# Patient Record
Sex: Female | Born: 1980 | Hispanic: No | Marital: Married | Smoking: Never smoker
Health system: Southern US, Community
[De-identification: ages and names within clinical notes are randomized; demographics above are authoritative.]

## PROBLEM LIST (undated history)

## (undated) DIAGNOSIS — IMO0002 Reserved for concepts with insufficient information to code with codable children: Secondary | ICD-10-CM

## (undated) DIAGNOSIS — D259 Leiomyoma of uterus, unspecified: Secondary | ICD-10-CM

## (undated) DIAGNOSIS — D696 Thrombocytopenia, unspecified: Secondary | ICD-10-CM

## (undated) HISTORY — PX: UTERINE FIBROID SURGERY: SHX826

## (undated) HISTORY — DX: Leiomyoma of uterus, unspecified: D25.9

## (undated) HISTORY — PX: NO PAST SURGERIES: SHX2092

## (undated) HISTORY — DX: Reserved for concepts with insufficient information to code with codable children: IMO0002

---

## 2009-06-17 ENCOUNTER — Inpatient Hospital Stay (HOSPITAL_COMMUNITY): Admission: AD | Admit: 2009-06-17 | Discharge: 2009-06-17 | Payer: Self-pay | Admitting: Obstetrics & Gynecology

## 2009-06-19 ENCOUNTER — Inpatient Hospital Stay (HOSPITAL_COMMUNITY): Admission: AD | Admit: 2009-06-19 | Discharge: 2009-06-19 | Payer: Self-pay | Admitting: Obstetrics & Gynecology

## 2009-06-21 ENCOUNTER — Inpatient Hospital Stay (HOSPITAL_COMMUNITY): Admission: AD | Admit: 2009-06-21 | Discharge: 2009-06-21 | Payer: Self-pay | Admitting: Obstetrics and Gynecology

## 2009-07-02 ENCOUNTER — Inpatient Hospital Stay (HOSPITAL_COMMUNITY): Admission: AD | Admit: 2009-07-02 | Discharge: 2009-07-02 | Payer: Self-pay | Admitting: Obstetrics & Gynecology

## 2009-07-30 ENCOUNTER — Encounter: Payer: Self-pay | Admitting: Family

## 2009-07-30 ENCOUNTER — Ambulatory Visit: Payer: Self-pay | Admitting: Family Medicine

## 2010-07-03 ENCOUNTER — Encounter: Payer: Self-pay | Admitting: Obstetrics & Gynecology

## 2010-07-04 ENCOUNTER — Inpatient Hospital Stay (HOSPITAL_COMMUNITY)
Admission: AD | Admit: 2010-07-04 | Discharge: 2010-07-07 | Payer: Self-pay | Source: Home / Self Care | Attending: Obstetrics and Gynecology | Admitting: Obstetrics and Gynecology

## 2010-07-05 LAB — CBC
Hemoglobin: 11.8 g/dL — ABNORMAL LOW (ref 12.0–15.0)
MCHC: 36.6 g/dL — ABNORMAL HIGH (ref 30.0–36.0)
MCV: 83 fL (ref 78.0–100.0)
Platelets: 124 10*3/uL — ABNORMAL LOW (ref 150–400)
RDW: 13.4 % (ref 11.5–15.5)

## 2010-07-06 LAB — CBC
HCT: 24.7 % — ABNORMAL LOW (ref 36.0–46.0)
Hemoglobin: 12 g/dL (ref 12.0–15.0)
Hemoglobin: 8.9 g/dL — ABNORMAL LOW (ref 12.0–15.0)
MCH: 30.5 pg (ref 26.0–34.0)
MCHC: 36 g/dL (ref 30.0–36.0)
MCV: 84.6 fL (ref 78.0–100.0)
Platelets: 121 10*3/uL — ABNORMAL LOW (ref 150–400)
RBC: 3.96 MIL/uL (ref 3.87–5.11)
RBC: 2.92 MIL/uL — ABNORMAL LOW (ref 3.87–5.11)
WBC: 12.3 10*3/uL — ABNORMAL HIGH (ref 4.0–10.5)

## 2010-07-06 LAB — ABO/RH: ABO/RH(D): AB POS

## 2010-07-06 LAB — RPR: RPR Ser Ql: NONREACTIVE

## 2010-07-07 LAB — CBC
MCH: 30.7 pg (ref 26.0–34.0)
MCHC: 35.9 g/dL (ref 30.0–36.0)
MCV: 85.6 fL (ref 78.0–100.0)
Platelets: 113 10*3/uL — ABNORMAL LOW (ref 150–400)
RBC: 3.06 MIL/uL — ABNORMAL LOW (ref 3.87–5.11)
RDW: 13.4 % (ref 11.5–15.5)

## 2010-08-28 LAB — CBC
HCT: 38.2 % (ref 36.0–46.0)
Hemoglobin: 12.2 g/dL (ref 12.0–15.0)
Hemoglobin: 13 g/dL (ref 12.0–15.0)
Hemoglobin: 13.5 g/dL (ref 12.0–15.0)
MCHC: 34.2 g/dL (ref 30.0–36.0)
MCV: 88 fL (ref 78.0–100.0)
MCV: 88.7 fL (ref 78.0–100.0)
Platelets: 141 10*3/uL — ABNORMAL LOW (ref 150–400)
RBC: 4.02 MIL/uL (ref 3.87–5.11)
WBC: 5.9 10*3/uL (ref 4.0–10.5)
WBC: 7.6 10*3/uL (ref 4.0–10.5)
WBC: 8.5 10*3/uL (ref 4.0–10.5)

## 2010-08-28 LAB — URINE MICROSCOPIC-ADD ON

## 2010-08-28 LAB — DIFFERENTIAL
Basophils Relative: 1 % (ref 0–1)
Eosinophils Relative: 1 % (ref 0–5)
Lymphocytes Relative: 16 % (ref 12–46)
Lymphs Abs: 1.4 10*3/uL (ref 0.7–4.0)
Monocytes Absolute: 0.4 10*3/uL (ref 0.1–1.0)

## 2010-08-28 LAB — WET PREP, GENITAL: Yeast Wet Prep HPF POC: NONE SEEN

## 2010-08-28 LAB — URINALYSIS, ROUTINE W REFLEX MICROSCOPIC
Glucose, UA: NEGATIVE mg/dL
Urobilinogen, UA: 0.2 mg/dL (ref 0.0–1.0)

## 2010-08-28 LAB — ABO/RH: ABO/RH(D): AB POS

## 2010-08-29 LAB — CBC: RDW: 12.4 % (ref 11.5–15.5)

## 2014-02-04 LAB — OB RESULTS CONSOLE HEPATITIS B SURFACE ANTIGEN: HEP B S AG: NEGATIVE

## 2014-02-04 LAB — OB RESULTS CONSOLE RUBELLA ANTIBODY, IGM: Rubella: IMMUNE

## 2014-02-04 LAB — OB RESULTS CONSOLE GC/CHLAMYDIA
CHLAMYDIA, DNA PROBE: NEGATIVE
Chlamydia: NEGATIVE
GC PROBE AMP, GENITAL: NEGATIVE
Gonorrhea: NEGATIVE

## 2014-02-04 LAB — OB RESULTS CONSOLE HIV ANTIBODY (ROUTINE TESTING): HIV: NONREACTIVE

## 2014-05-12 LAB — OB RESULTS CONSOLE RPR: RPR: NONREACTIVE

## 2014-06-12 NOTE — L&D Delivery Note (Addendum)
60434: Tech informed provider of imminent delivery.  In room to assess, patient +5 station with nurse supporting fetal head.  Patient instructed on pushing techniques and delivered as below.   Delivery Note At 4:36 AM, on 08/19/2014, a viable female was delivered via Vaginal, Spontaneous Delivery (Presentation: Occiput Anterior).  Shoulders delivered easily and infant with good tone. Tactile stimulation and bulb suction given by provider and infant placed on mother's abdomen where nurse continued tactile stimulation. Infant APGAR: 7, 9. Cord clamped, cut, and blood collected.  Vaginal inspection revealed 2nd degree lacerations. Lidocaine anesthetic obtained and repair performed with 3-0 vicryl on CT-1.  Placenta delivered spontaneously, after initiation of pitocin for active management, and noted to be intact with 3VC upon inspection.  Patient with constant trickle and cervical laceration could not be ruled out.  Dr. Kathie RhodesS. Nadene Witherspoon consulted and in to assess.  After examination, no cervical laceration found.  Patient tolerated procedure well with IV morphine. Fundus firm, at the umbilicus, and bleeding small.  Mother hemodynamically stable and infant swaddled, with father, prior to provider exit.  Mother unsure of birth control method and opts to breastfeed.  Infant weight at one hour of life: 8 lb 1.6 oz (3674 g).    Anesthesia: Local  Episiotomy: None Lacerations: 2nd degree Suture Repair: 3.0 vicryl Est. Blood Loss (mL): 450  Mom to postpartum.  Baby to Couplet care / Skin to Skin.  EMLY, JESSICA LYNN MSN, CNM 08/19/2014, 6:33 AM   Called in to evaluate for possible cervical laceration. Patient had just received Morphine 4 mg iv Complete cervical revision is normal as well as vaginal revision. Fundus is firm and bleeding is normal. Reassurance

## 2014-07-11 LAB — OB RESULTS CONSOLE GBS: STREP GROUP B AG: POSITIVE

## 2014-08-18 ENCOUNTER — Inpatient Hospital Stay (HOSPITAL_COMMUNITY)
Admission: AD | Admit: 2014-08-18 | Discharge: 2014-08-21 | DRG: 775 | Disposition: A | Discharge status: Home / Self Care | Payer: Medicaid Other | Source: Ambulatory Visit | Attending: Obstetrics and Gynecology | Admitting: Obstetrics and Gynecology

## 2014-08-18 DIAGNOSIS — O99344 Other mental disorders complicating childbirth: Secondary | ICD-10-CM | POA: Diagnosis present

## 2014-08-18 DIAGNOSIS — Z3483 Encounter for supervision of other normal pregnancy, third trimester: Secondary | ICD-10-CM | POA: Diagnosis present

## 2014-08-18 DIAGNOSIS — O99824 Streptococcus B carrier state complicating childbirth: Secondary | ICD-10-CM | POA: Diagnosis present

## 2014-08-18 DIAGNOSIS — O9912 Other diseases of the blood and blood-forming organs and certain disorders involving the immune mechanism complicating childbirth: Secondary | ICD-10-CM | POA: Diagnosis present

## 2014-08-18 DIAGNOSIS — F329 Major depressive disorder, single episode, unspecified: Secondary | ICD-10-CM | POA: Diagnosis present

## 2014-08-18 DIAGNOSIS — O480 Post-term pregnancy: Secondary | ICD-10-CM | POA: Diagnosis present

## 2014-08-18 DIAGNOSIS — Z3A40 40 weeks gestation of pregnancy: Secondary | ICD-10-CM | POA: Diagnosis present

## 2014-08-18 HISTORY — DX: Thrombocytopenia, unspecified: D69.6

## 2014-08-18 NOTE — H&P (Signed)
  Diane Zimmerman is a 34 y.o. female, [id number] at 40.6 weeks, presenting for contractions.  Upon arrival in [facility], patient with SROM.  Reports good fetal movement and denies VB.  GBS Positive and thrombocytopenia.   There are no active problems to display for this patient.   History of present pregnancy: Patient entered care at 13.5 weeks.   EDC of 08/13/2014 was established by Definite LMP of 5/28/25015.   Anatomy scan:  18.4 weeks, with normal findings and an posterior placenta.   Additional US evaluations:   -Anatomy: EFW 8 oz. 35.3 %. Cx length 5.12cm. Vertex pres. Posterial fundal placenta. Cx closed. AFI WNL. Vertical pocket= 4.9 cm. No apparent abnormalities seen. Profile/ Palate seen. NB seen. Philtrum seen. Open hands, 5th digit, heel and feet seen. Female gender. Ovaries and adnexas are WNL -35wks: US: SIUP, VERTEX, NORMAL FLUID, GROWTH 52%.   Significant prenatal events:  Patient without complaints throughout pregnancy. Noted to have thrombocytopenia at 38wks.  Patient also noted as having AB+ bloody type with A2B subgroup of anti-A. Last evaluation:  08/18/2014 by L. Montez Moritaarter, FNP. VE 2/50/-2, FHR 144, BP 100/50, wt 180lbs  OB History    No data available     No past medical history on file. No past surgical history on file. Family History: family history is not on file. Social History:  has no tobacco, alcohol, and drug history on file.   Prenatal Transfer Tool  Maternal Diabetes: No Genetic Screening: Normal Maternal Ultrasounds/Referrals: Normal Fetal Ultrasounds or other Referrals:  None Maternal Substance Abuse:  No Significant Maternal Medications:  None Significant Maternal Lab Results: Lab values include: Group B Strep positive    ROS:  +ctx, +lof, +fm, -vb  Allergies not on file     Blood pressure 126/65, pulse 83, temperature 97.6 F (36.4 C), temperature source Oral, resp. rate 18, height 5\' 4"  (1.626 m), weight 180 lb (81.647 kg), last menstrual  period 11/06/2013, unknown if currently breastfeeding.  Chest clear Heart RRR without murmur Abd gravid, NT Pelvic: 4/90/-2 Ext: WNL  FHR: 145 bpm, Mod Var, -Decels, +Accels UCs: palpates moderate  Prenatal labs: ABO, Rh: --/--/AB POS (03/09 0015) Antibody: NEG (03/09 0015) Rubella:   Immune RPR: Nonreactive (12/01 0000)  HBsAg: Negative (08/26 0000)  HIV: Non-reactive (08/26 0000)  GBS: Positive (01/30 0000) Sickle cell/Hgb electrophoresis:  Normal Pap:  Abnormal-ASCUS GC:  Negative Chlamydia:  Negative Genetic screenings:  Negative Glucola:  Negative Other:  HPV-Negative    Assessment IUP at 40.6wks Cat I FT GBS Positive SROM Desires Unmedicated Birth  [facility]: [facility] consult with Dr. Kathie RhodesS. Rivard Routine Labor and Delivery Orders per [facility] Protocol  Gerrit HeckMLY, Cayden Granholm [facility]fieldYNNCNM, MSN 08/18/2014, 11:51 PM

## 2014-08-19 ENCOUNTER — Encounter (HOSPITAL_COMMUNITY): Payer: Self-pay | Admitting: *Deleted

## 2014-08-19 LAB — CBC
HEMATOCRIT: 33.7 % — AB (ref 36.0–46.0)
HEMOGLOBIN: 12 g/dL (ref 12.0–15.0)
MCH: 31.5 pg (ref 26.0–34.0)
MCHC: 35.6 g/dL (ref 30.0–36.0)
MCV: 88.5 fL (ref 78.0–100.0)
Platelets: 106 10*3/uL — ABNORMAL LOW (ref 150–400)
RBC: 3.81 MIL/uL — AB (ref 3.87–5.11)
RDW: 13.3 % (ref 11.5–15.5)
WBC: 9.4 10*3/uL (ref 4.0–10.5)

## 2014-08-19 LAB — RPR: RPR Ser Ql: NONREACTIVE

## 2014-08-19 MED ORDER — SODIUM CHLORIDE 0.9 % IV SOLN
2.0000 g | Freq: Once | INTRAVENOUS | Status: AC
  Administered 2014-08-19: 2 g via INTRAVENOUS
  Filled 2014-08-19: qty 2000

## 2014-08-19 MED ORDER — OXYTOCIN BOLUS FROM INFUSION
500.0000 mL | INTRAVENOUS | Status: DC
  Administered 2014-08-19: 500 mL via INTRAVENOUS

## 2014-08-19 MED ORDER — LACTATED RINGERS IV SOLN
INTRAVENOUS | Status: DC
Start: 2014-08-19 — End: 2014-08-19
  Administered 2014-08-19: via INTRAVENOUS

## 2014-08-19 MED ORDER — LANOLIN HYDROUS EX OINT: TOPICAL_OINTMENT | CUTANEOUS | Status: DC | PRN

## 2014-08-19 MED ORDER — OXYCODONE-ACETAMINOPHEN 5-325 MG PO TABS
2.0000 | ORAL_TABLET | ORAL | Status: DC | PRN
Start: 2014-08-19 — End: 2014-08-21
  Administered 2014-08-19: 2 via ORAL
  Filled 2014-08-19: qty 2

## 2014-08-19 MED ORDER — ZOLPIDEM TARTRATE 5 MG PO TABS: 5.0000 mg | ORAL_TABLET | Freq: Every evening | ORAL | Status: DC | PRN

## 2014-08-19 MED ORDER — WITCH HAZEL-GLYCERIN EX PADS: 1.0000 "application " | MEDICATED_PAD | CUTANEOUS | Status: DC | PRN

## 2014-08-19 MED ORDER — OXYCODONE-ACETAMINOPHEN 5-325 MG PO TABS: 1.0000 | ORAL_TABLET | ORAL | Status: DC | PRN

## 2014-08-19 MED ORDER — MORPHINE SULFATE 4 MG/ML IJ SOLN
4.0000 mg | Freq: Once | INTRAMUSCULAR | Status: AC
  Administered 2014-08-19: 4 mg via INTRAVENOUS
  Filled 2014-08-19: qty 1

## 2014-08-19 MED ORDER — ONDANSETRON HCL 4 MG/2ML IJ SOLN: 4.0000 mg | INTRAMUSCULAR | Status: DC | PRN

## 2014-08-19 MED ORDER — IBUPROFEN 600 MG PO TABS
600.0000 mg | ORAL_TABLET | Freq: Four times a day (QID) | ORAL | Status: DC
  Administered 2014-08-19 – 2014-08-21 (×10): 600 mg via ORAL
  Filled 2014-08-19 (×10): qty 1

## 2014-08-19 MED ORDER — CITRIC ACID-SODIUM CITRATE 334-500 MG/5ML PO SOLN: 30.0000 mL | ORAL | Status: DC | PRN

## 2014-08-19 MED ORDER — SIMETHICONE 80 MG PO CHEW: 80.0000 mg | CHEWABLE_TABLET | ORAL | Status: DC | PRN

## 2014-08-19 MED ORDER — DIBUCAINE 1 % RE OINT
1.0000 "application " | TOPICAL_OINTMENT | RECTAL | Status: DC | PRN
  Filled 2014-08-19: qty 28

## 2014-08-19 MED ORDER — TETANUS-DIPHTH-ACELL PERTUSSIS 5-2.5-18.5 LF-MCG/0.5 IM SUSP: 0.5000 mL | Freq: Once | INTRAMUSCULAR | Status: DC

## 2014-08-19 MED ORDER — BENZOCAINE-MENTHOL 20-0.5 % EX AERO
1.0000 "application " | INHALATION_SPRAY | CUTANEOUS | Status: DC | PRN
  Administered 2014-08-19: 1 via TOPICAL
  Filled 2014-08-19 (×4): qty 56

## 2014-08-19 MED ORDER — SENNOSIDES-DOCUSATE SODIUM 8.6-50 MG PO TABS
2.0000 | ORAL_TABLET | ORAL | Status: DC
  Administered 2014-08-19: 2 via ORAL
  Filled 2014-08-19: qty 2

## 2014-08-19 MED ORDER — NALBUPHINE HCL 10 MG/ML IJ SOLN
10.0000 mg | INTRAMUSCULAR | Status: DC | PRN
  Administered 2014-08-19 (×2): 10 mg via INTRAVENOUS
  Filled 2014-08-19 (×2): qty 1

## 2014-08-19 MED ORDER — PRENATAL MULTIVITAMIN CH
1.0000 | ORAL_TABLET | Freq: Every day | ORAL | Status: DC
  Administered 2014-08-19 – 2014-08-21 (×3): 1 via ORAL
  Filled 2014-08-19 (×3): qty 1

## 2014-08-19 MED ORDER — OXYCODONE-ACETAMINOPHEN 5-325 MG PO TABS: 2.0000 | ORAL_TABLET | ORAL | Status: DC | PRN

## 2014-08-19 MED ORDER — LIDOCAINE HCL (PF) 1 % IJ SOLN
30.0000 mL | INTRAMUSCULAR | Status: AC | PRN
  Administered 2014-08-19: 30 mL via SUBCUTANEOUS
  Filled 2014-08-19: qty 30

## 2014-08-19 MED ORDER — ACETAMINOPHEN 325 MG PO TABS: 650.0000 mg | ORAL_TABLET | ORAL | Status: DC | PRN

## 2014-08-19 MED ORDER — DIPHENHYDRAMINE HCL 25 MG PO CAPS: 25.0000 mg | ORAL_CAPSULE | Freq: Four times a day (QID) | ORAL | Status: DC | PRN

## 2014-08-19 MED ORDER — OXYCODONE-ACETAMINOPHEN 5-325 MG PO TABS
1.0000 | ORAL_TABLET | ORAL | Status: DC | PRN
  Administered 2014-08-19 – 2014-08-21 (×3): 1 via ORAL
  Filled 2014-08-19 (×3): qty 1

## 2014-08-19 MED ORDER — ONDANSETRON HCL 4 MG PO TABS
4.0000 mg | ORAL_TABLET | ORAL | Status: DC | PRN
Start: 2014-08-19 — End: 2014-08-21

## 2014-08-19 MED ORDER — OXYTOCIN 40 UNITS IN LACTATED RINGERS INFUSION - SIMPLE MED
62.5000 mL/h | INTRAVENOUS | Status: DC
  Filled 2014-08-19: qty 1000

## 2014-08-19 MED ORDER — ONDANSETRON HCL 4 MG/2ML IJ SOLN: 4.0000 mg | Freq: Four times a day (QID) | INTRAMUSCULAR | Status: DC | PRN

## 2014-08-19 MED ORDER — LACTATED RINGERS IV SOLN: 500.0000 mL | INTRAVENOUS | Status: DC | PRN

## 2014-08-19 NOTE — Lactation Note (Signed)
This note was copied from the chart of Diane Felina Oswald HillockLaba Ourod Aouda. Lactation Consultation Note Initial visit at 18 hours of age.  Mom is breast and bottle feeding.  Mom reports supplementing with older children as well.  Mom denies pain or concerns about breastfeeding. [facility] resources given and discussed.  Encouraged to feed with early cues on demand.  Early newborn behavior discussed.  Hand expression reported by mom with colostrum visible.  Mom to call for assist as needed.      Patient Name: Diane Zimmerman Reason for consult: Initial assessment   Maternal Data Has patient been taught Hand Expression?: Yes Does the patient have breastfeeding experience prior to this delivery?: Yes  Feeding    LATCH Score/Interventions                Intervention(s): Breastfeeding basics reviewed     Lactation Tools Discussed/Used     Consult Status Consult Status: PRN    Jannifer RodneyShoptaw, [name], 10:48 PM

## 2014-08-19 NOTE — Plan of Care (Signed)
Problem: Phase II Progression Outcomes Goal: Initiate breastfeeding within 1hr delivery Outcome: Not Applicable Date Met:  34/75/83 Patient does not want to breastfeed at this time

## 2014-08-19 NOTE — Progress Notes (Signed)
Subjective: Postpartum Day 0: Vaginal delivery, 2nd degree perineal laceration Patient up ad lib, reports no syncope or dizziness. Feeding:  Breast Contraceptive plan:  Undecided  Objective: Vital signs in last 24 hours: Temp:  [97.6 F (36.4 C)-99.7 F (37.6 C)] 98.2 F (36.8 C) (03/09 0847) Pulse Rate:  [79-90] 83 (03/09 0847) Resp:  [18-24] 18 (03/09 0847) BP: (110-137)/(52-68) 110/58 mmHg (03/09 0847) Weight:  [180 lb (81.647 kg)] 180 lb (81.647 kg) (03/09 0009)  Physical Exam:  General: alert Lochia: appropriate Uterine Fundus: firm Perineum: healing well DVT Evaluation: No evidence of DVT seen on physical exam. Negative Homan's sign.    Recent Labs  08/19/14 0015  HGB 12.0  HCT 33.7*  Platelets 106 on admission  Assessment/Plan: Status post vaginal delivery day 0 Hx thrombocytopenia--platelets 106 on admission Stable Continue current care. CBC tomorrow.   Jeffie Spivack, VICKICNM 08/19/2014, 10:02 AM

## 2014-08-19 NOTE — Plan of Care (Signed)
Problem: Consults Goal: Birthing Suites Patient Information Press F2 to bring up selections list  Outcome: Completed/Met Date Met:  08/19/14  Pt > [redacted] weeks EGA

## 2014-08-19 NOTE — Progress Notes (Signed)
UR chart review completed.  

## 2014-08-20 LAB — CBC
HCT: 28.1 % — ABNORMAL LOW (ref 36.0–46.0)
Hemoglobin: 10 g/dL — ABNORMAL LOW (ref 12.0–15.0)
MCH: 31.5 pg (ref 26.0–34.0)
MCHC: 35.6 g/dL (ref 30.0–36.0)
MCV: 88.6 fL (ref 78.0–100.0)
PLATELETS: 104 10*3/uL — AB (ref 150–400)
RBC: 3.17 MIL/uL — AB (ref 3.87–5.11)
RDW: 13.5 % (ref 11.5–15.5)
WBC: 15 10*3/uL — ABNORMAL HIGH (ref 4.0–10.5)

## 2014-08-20 LAB — HIV ANTIBODY (ROUTINE TESTING W REFLEX): HIV SCREEN 4TH GENERATION: NONREACTIVE

## 2014-08-20 NOTE — Progress Notes (Signed)
Subjective: Postpartum Day 1: Vaginal delivery, 2nd degree perineal laceration Patient up ad lib, reports no syncope or dizziness. Feeding:  Breast and bottle Contraceptive plan:  Undecided  Objective: Vital signs in last 24 hours: Temp:  [98 F (36.7 C)-98.6 F (37 C)] 98 F (36.7 C) (03/10 0523) Pulse Rate:  [73-86] 85 (03/10 0523) Resp:  [18] 18 (03/10 0523) BP: (107-126)/(58-69) 107/63 mmHg (03/10 0523) SpO2:  [100 %] 100 % (03/10 0523)  Physical Exam:  General: alert Lochia: appropriate Uterine Fundus: firm Perineum: healing well DVT Evaluation: No evidence of DVT seen on physical exam. Negative Homan's sign.  CBC Latest Ref Rng 08/20/2014 08/19/2014 07/07/2010  WBC 4.0 - 10.5 K/uL 15.0(H) 9.4 9.4  Hemoglobin 12.0 - 15.0 g/dL 10.0(L) 12.0 9.4(L)  Hematocrit 36.0 - 46.0 % 28.1(L) 33.7(L) 26.2(L)  Platelets 150 - 400 K/uL 104(L) 106(L) 113 CONSISTENT WITH PREVIOUS RESULT(L)    Assessment/Plan: Status post vaginal delivery day 1 Thrombocytopenia--stable Stable Continue current care. Plan for discharge tomorrow    Nyra CapesLATHAM, VICKICNM 08/20/2014, 7:56 AM

## 2014-08-21 MED ORDER — IBUPROFEN 600 MG PO TABS: 600.0000 mg | ORAL_TABLET | Freq: Four times a day (QID) | ORAL | Status: DC

## 2014-08-21 MED ORDER — FERROUS SULFATE 325 (65 FE) MG PO TBEC: 325.0000 mg | DELAYED_RELEASE_TABLET | Freq: Two times a day (BID) | ORAL | Status: DC

## 2014-08-21 MED ORDER — OXYCODONE-ACETAMINOPHEN 5-325 MG PO TABS: 1.0000 | ORAL_TABLET | ORAL | Status: DC | PRN

## 2014-08-21 NOTE — Discharge Summary (Signed)
Vaginal Delivery Discharge Summary  ALL information will be verified prior to discharge  Diane Zimmerman  DOB:    11-Nov-1980 MRN:    [id number] CSN:    [id number]  Date of admission:                  08/18/14  Date of discharge:                   08/21/14  Procedures this admission: SVD  Date of Delivery: 08/19/14  Newborn Data:  Live born  Information for the patient's newborn:  Jadamarie Butson, Girl Sienna [[id number]]  female  APGAR ,   Live born female  Birth Weight: 8 lb 1.6 oz (3674 g) APGAR: 7, 9  Weight    Home with mother.   History of Present Illness: Diane Zimmerman is a 34 y.o. female, [phone], who presents at [redacted]w[redacted]d weeks gestation. The patient has been followed at the [facility] division of [facility]. She was admitted onset of labor. Her pregnancy has been complicated by:  Patient Active Problem List   Diagnosis Date Noted  . SVD (spontaneous vaginal delivery) 08/19/2014  . Second-degree perineal laceration, with delivery 08/19/2014    Hospital course: The patient was admitted for labor.   Her labor was not complicated. She proceeded to have a vaginal delivery of a healthy infant. Her delivery was not complicated. Her postpartum course was not complicated. She was discharged to home on postpartum day 2 doing well.  Feeding: breast  Contraception: unsure   Discharge hemoglobin: HEMOGLOBIN  Date Value Ref Range Status  08/20/2014 10.0* 12.0 - 15.0 g/dL Final   HCT  Date Value Ref Range Status  08/20/2014 28.1* 36.0 - 46.0 % Final    PreNatal Labs ABO, Rh: --/--/AB POS (03/09 0015)   Antibody: NEG (03/09 0015) Rubella:   immune RPR: Non Reactive (03/09 0015)  HBsAg: Negative (08/26 0000)  HIV: Non-reactive (08/26 0000)  GBS: Positive (01/30 0000)  Discharge Physical Exam:  General: alert and cooperative Lochia: appropriate Uterine Fundus: firm Incision: healing  well DVT Evaluation: No evidence of DVT seen on physical exam.  Intrapartum Procedures: spontaneous vaginal delivery and GBS prophylaxis Postpartum Procedures: none Complications-Operative and Postpartum: 2 degree perineal laceration  Discharge Diagnoses: Post-date pregnancy,  asymptomatic anemia  Activity:           pelvic rest Diet:                routine Medications: PNV, Ibuprofen, Iron and Percocet Condition:      stable     Postpartum Teaching: Nutrition, exercise, return to work or school, family visits, sexual activity, home rest, vaginal bleeding, pelvic rest, family planning, s/s of PPD, breast care peri-care and incision care   Discharge to: home  Follow-up Information    Follow up with [facility]. Schedule an appointment as soon as possible for a visit in 6 weeks.   Specialty:  Obstetrics and Gynecology   Why:  Postpartum check up   Contact information:   [address] [place] [address] [phone]       Adelina Mings, CNM, MSN 08/21/2014. 10:41 AM   Postpartum Care After Vaginal Delivery  After you deliver your newborn (postpartum period), the usual stay in the hospital is 24 72 hours. If there were problems with your labor or delivery, or if you have other medical problems, you  might be in the hospital longer.  While you are in the hospital, you will receive help and instructions on how to care for yourself and your newborn during the postpartum period.  While you are in the hospital:  Be sure to tell your nurses if you have pain or discomfort, as well as where you feel the pain and what makes the pain worse.  If you had an incision made near your vagina (episiotomy) or if you had some tearing during delivery, the nurses may put ice packs on your episiotomy or tear. The ice packs may help to reduce the pain and swelling.  If you are breastfeeding, you may feel uncomfortable contractions of  your uterus for a couple of weeks. This is normal. The contractions help your uterus get back to normal size.  It is normal to have some bleeding after delivery.  For the first 1 3 days after delivery, the flow is red and the amount may be similar to a period.  It is common for the flow to start and stop.  In the first few days, you may pass some small clots. Let your nurses know if you begin to pass large clots or your flow increases.  Zimmerman not  flush blood clots down the toilet before having the nurse look at them.  During the next 3 10 days after delivery, your flow should become more watery and pink or brown-tinged in color.  Ten to fourteen days after delivery, your flow should be a small amount of yellowish-white discharge.  The amount of your flow will decrease over the first few weeks after delivery. Your flow may stop in 6 8 weeks. Most women have had their flow stop by 12 weeks after delivery.  You should change your sanitary pads frequently.  Wash your hands thoroughly with soap and water for at least 20 seconds after changing pads, using the toilet, or before holding or feeding your newborn.  You should feel like you need to empty your bladder within the first 6 8 hours after delivery.  In case you become weak, lightheaded, or faint, call your nurse before you get out of bed for the first time and before you take a shower for the first time.  Within the first few days after delivery, your breasts may begin to feel tender and full. This is called engorgement. Breast tenderness usually goes away within 48 72 hours after engorgement occurs. You may also notice milk leaking from your breasts. If you are not breastfeeding, Zimmerman not stimulate your breasts. Breast stimulation can make your breasts produce more milk.  Spending as much time as possible with your newborn is very important. During this time, you and your newborn can feel close and get to know each other. Having your newborn  stay in your room (rooming in) will help to strengthen the bond with your newborn. It will give you time to get to know your newborn and become comfortable caring for your newborn.  Your hormones change after delivery. Sometimes the hormone changes can temporarily cause you to feel sad or tearful. These feelings should not last more than a few days. If these feelings last longer than that, you should talk to your caregiver.  If desired, talk to your caregiver about methods of family planning or contraception.  Talk to your caregiver about immunizations. Your caregiver may want you to have the following immunizations before leaving the hospital:  Tetanus, diphtheria, and pertussis (Tdap) or tetanus and diphtheria (Td)  immunization. It is very important that you and your family (including grandparents) or others caring for your newborn are up-to-date with the Tdap or Td immunizations. The Tdap or Td immunization can help protect your newborn from getting ill.  Rubella immunization.  Varicella (chickenpox) immunization.  Influenza immunization. You should receive this annual immunization if you did not receive the immunization during your pregnancy. Document Released: 03/26/2007 Document Revised: 02/21/2012 Document Reviewed: 01/24/2012 [facility] Patient Information 2014 [place], [place].   Postpartum Depression and Baby Blues  The postpartum period begins right after the birth of a baby. During this time, there is often a great amount of joy and excitement. It is also a time of considerable changes in the life of the parent(s). Regardless of how many times a mother gives birth, each child brings new challenges and dynamics to the family. It is not unusual to have feelings of excitement accompanied by confusing shifts in moods, emotions, and thoughts. All mothers are at risk of developing postpartum depression or the "baby blues." These mood changes can occur right after giving birth, or they may  occur many months after giving birth. The baby blues or postpartum depression can be mild or severe. Additionally, postpartum depression can resolve rather quickly, or it can be a long-term condition. CAUSES Elevated hormones and their rapid decline are thought to be a main cause of postpartum depression and the baby blues. There are a number of hormones that radically change during and after pregnancy. Estrogen and progesterone usually decrease immediately after delivering your baby. The level of thyroid hormone and various cortisol steroids also rapidly drop. Other factors that play a major role in these changes include major life events and genetics.  RISK FACTORS If you have any of the following risks for the baby blues or postpartum depression, know what symptoms to watch out for during the postpartum period. Risk factors that may increase the likelihood of getting the baby blues or postpartum depression include: 1. Havinga personal or family history of depression. 2. Having depression while being pregnant. 3. Having premenstrual or oral contraceptive-associated mood issues. 4. Having exceptional life stress. 5. Having marital conflict. 6. Lacking a social support network. 7. Having a baby with special needs. 8. Having health problems such as diabetes. SYMPTOMS Baby blues symptoms include:  Brief fluctuations in mood, such as going from extreme happiness to sadness.  Decreased concentration.  Difficulty sleeping.  Crying spells, tearfulness.  Irritability.  Anxiety. Postpartum depression symptoms typically begin within the first month after giving birth. These symptoms include:  Difficulty sleeping or excessive sleepiness.  Marked weight loss.  Agitation.  Feelings of worthlessness.  Lack of interest in activity or food. Postpartum psychosis is a very concerning condition and can be dangerous. Fortunately, it is rare. Displaying any of the following symptoms is cause for  immediate medical attention. Postpartum psychosis symptoms include:  Hallucinations and delusions.  Bizarre or disorganized behavior.  Confusion or disorientation. DIAGNOSIS  A diagnosis is made by an evaluation of your symptoms. There are no medical or lab tests that lead to a diagnosis, but there are various questionnaires that a caregiver may use to identify those with the baby blues, postpartum depression, or psychosis. Often times, a screening tool called the [place] Postnatal Depression Scale is used to diagnose depression in the postpartum period.  TREATMENT The baby blues usually goes away on its own in 1 to 2 weeks. Social support is often all that is needed. You should be encouraged to get adequate  sleep and rest. Occasionally, you may be given medicines to help you sleep.  Postpartum depression requires treatment as it can last several months or longer if it is not treated. Treatment may include individual or group therapy, medicine, or both to address any social, physiological, and psychological factors that may play a role in the depression. Regular exercise, a healthy diet, rest, and social support may also be strongly recommended.  Postpartum psychosis is more serious and needs treatment right away. Hospitalization is often needed. HOME CARE INSTRUCTIONS  Get as much rest as you can. Nap when the baby sleeps.  Exercise regularly. Some women find yoga and walking to be beneficial.  Eat a balanced and nourishing diet.  Zimmerman little things that you enjoy. Have a cup of tea, take a bubble bath, read your favorite magazine, or listen to your favorite music.  Avoid alcohol.  Ask for help with household chores, cooking, grocery shopping, or running errands as needed. Zimmerman not try to Zimmerman everything.  Talk to people close to you about how you are feeling. Get support from your partner, family members, friends, or other new moms.  Try to stay positive in how you think. Think about the  things you are grateful for.  Zimmerman not spend a lot of time alone.  Only take medicine as directed by your caregiver.  Keep all your postpartum appointments.  Let your caregiver know if you have any concerns. SEEK MEDICAL CARE IF: You are having a reaction or problems with your medicine. SEEK IMMEDIATE MEDICAL CARE IF:  You have suicidal feelings.  You feel you may harm the baby or someone else. Document Released: 03/02/2004 Document Revised: 08/21/2011 Document Reviewed: 04/04/2011 [facility] Patient Information 2014 [place], [place].     Breastfeeding Deciding to breastfeed is one of the best choices you can make for you and your baby. A change in hormones during pregnancy causes your breast tissue to grow and increases the number and size of your milk ducts. These hormones also allow proteins, sugars, and fats from your blood supply to make breast milk in your milk-producing glands. Hormones prevent breast milk from being released before your baby is born as well as prompt milk flow after birth. Once breastfeeding has begun, thoughts of your baby, as well as his or her sucking or crying, can stimulate the release of milk from your milk-producing glands.  BENEFITS OF BREASTFEEDING For Your Baby  Your first milk (colostrum) helps your baby's digestive system function better.   There are antibodies in your milk that help your baby fight off infections.   Your baby has a lower incidence of asthma, allergies, and sudden infant death syndrome.   The nutrients in breast milk are better for your baby than infant formulas and are designed uniquely for your baby's needs.   Breast milk improves your baby's brain development.   Your baby is less likely to develop other conditions, such as childhood obesity, asthma, or type 2 diabetes mellitus.  For You   Breastfeeding helps to create a very special bond between you and your baby.   Breastfeeding is convenient. Breast milk is always  available at the correct temperature and costs nothing.   Breastfeeding helps to burn calories and helps you lose the weight gained during pregnancy.   Breastfeeding makes your uterus contract to its prepregnancy size faster and slows bleeding (lochia) after you give birth.   Breastfeeding helps to lower your risk of developing type 2 diabetes mellitus, osteoporosis, and breast  or ovarian cancer later in life. SIGNS THAT YOUR BABY IS HUNGRY Early Signs of Hunger  Increased alertness or activity.  Stretching.  Movement of the head from side to side.  Movement of the head and opening of the mouth when the corner of the mouth or cheek is stroked (rooting).  Increased sucking sounds, smacking lips, cooing, sighing, or squeaking.  Hand-to-mouth movements.  Increased sucking of fingers or hands. Late Signs of Hunger  Fussing.  Intermittent crying. Extreme Signs of Hunger Signs of extreme hunger will require calming and consoling before your baby will be able to breastfeed successfully. Zimmerman not wait for the following signs of extreme hunger to occur before you initiate breastfeeding:   Restlessness.  A loud, strong cry.   Screaming.   BREASTFEEDING BASICS Breastfeeding Initiation  Find a comfortable place to sit or lie down, with your neck and back well supported.  Place a pillow or rolled up blanket under your baby to bring him or her to the level of your breast (if you are seated). Nursing pillows are specially designed to help support your arms and your baby while you breastfeed.  Make sure that your baby's abdomen is facing your abdomen.   Gently massage your breast. With your fingertips, massage from your chest wall toward your nipple in a circular motion. This encourages milk flow. You may need to continue this action during the feeding if your milk flows slowly.  Support your breast with 4 fingers underneath and your thumb above your nipple. Make sure your  fingers are well away from your nipple and your baby's mouth.   Stroke your baby's lips gently with your finger or nipple.   When your baby's mouth is open wide enough, quickly bring your baby to your breast, placing your entire nipple and as much of the colored area around your nipple (areola) as possible into your baby's mouth.   More areola should be visible above your baby's upper lip than below the lower lip.   Your baby's tongue should be between his or her lower gum and your breast.   Ensure that your baby's mouth is correctly positioned around your nipple (latched). Your baby's lips should create a seal on your breast and be turned out (everted).  It is common for your baby to suck about 2-3 minutes in order to start the flow of breast milk. Latching Teaching your baby how to latch on to your breast properly is very important. An improper latch can cause nipple pain and decreased milk supply for you and poor weight gain in your baby. Also, if your baby is not latched onto your nipple properly, he or she may swallow some air during feeding. This can make your baby fussy. Burping your baby when you switch breasts during the feeding can help to get rid of the air. However, teaching your baby to latch on properly is still the best way to prevent fussiness from swallowing air while breastfeeding. Signs that your baby has successfully latched on to your nipple:    Silent tugging or silent sucking, without causing you pain.   Swallowing heard between every 3-4 sucks.    Muscle movement above and in front of his or her ears while sucking.  Signs that your baby has not successfully latched on to nipple:   Sucking sounds or smacking sounds from your baby while breastfeeding.  Nipple pain. If you think your baby has not latched on correctly, slip your finger into the corner of  your baby's mouth to break the suction and place it between your baby's gums. Attempt breastfeeding  initiation again. Signs of Successful Breastfeeding Signs from your baby:   A gradual decrease in the number of sucks or complete cessation of sucking.   Falling asleep.   Relaxation of his or her body.   Retention of a small amount of milk in his or her mouth.   Letting go of your breast by himself or herself. Signs from you:  Breasts that have increased in firmness, weight, and size 1-3 hours after feeding.   Breasts that are softer immediately after breastfeeding.  Increased milk volume, as well as a change in milk consistency and color by the fifth day of breastfeeding.   Nipples that are not sore, cracked, or bleeding. Signs That Your Pecola LeisureBaby is Getting Enough Milk  Wetting at least 3 diapers in a 24-hour period. The urine should be clear and pale yellow by age 655 days.  At least 3 stools in a 24-hour period by age 655 days. The stool should be soft and yellow.  At least 3 stools in a 24-hour period by age 65 days. The stool should be seedy and yellow.  No loss of weight greater than 10% of birth weight during the first 323 days of age.  Average weight gain of 4-7 ounces (113-198 g) per week after age 47 days.  Consistent daily weight gain by age 655 days, without weight loss after the age of 2 weeks. After a feeding, your baby may spit up a small amount. This is common. BREASTFEEDING FREQUENCY AND DURATION Frequent feeding will help you make more milk and can prevent sore nipples and breast engorgement. Breastfeed when you feel the need to reduce the fullness of your breasts or when your baby shows signs of hunger. This is called "breastfeeding on demand." Avoid introducing a pacifier to your baby while you are working to establish breastfeeding (the first 4-6 weeks after your baby is born). After this time you may choose to use a pacifier. Research has shown that pacifier use during the first year of a baby's life decreases the risk of sudden infant death syndrome  (SIDS). Allow your baby to feed on each breast as long as he or she wants. Breastfeed until your baby is finished feeding. When your baby unlatches or falls asleep while feeding from the first breast, offer the second breast. Because newborns are often sleepy in the first few weeks of life, you may need to awaken your baby to get him or her to feed. Breastfeeding times will vary from baby to baby. However, the following rules can serve as a guide to help you ensure that your baby is properly fed:  Newborns (babies 264 weeks of age or younger) may breastfeed every 1-3 hours.  Newborns should not go longer than 3 hours during the day or 5 hours during the night without breastfeeding.  You should breastfeed your baby a minimum of 8 times in a 24-hour period until you begin to introduce solid foods to your baby at around 156 months of age. BREAST MILK PUMPING Pumping and storing breast milk allows you to ensure that your baby is exclusively fed your breast milk, even at times when you are unable to breastfeed. This is especially important if you are going back to work while you are still breastfeeding or when you are not able to be present during feedings. Your lactation consultant can give you guidelines on how long it is  safe to store breast milk.  A breast pump is a machine that allows you to pump milk from your breast into a sterile bottle. The pumped breast milk can then be stored in a refrigerator or freezer. Some breast pumps are operated by hand, while others use electricity. Ask your lactation consultant which type will work best for you. Breast pumps can be purchased, but some hospitals and breastfeeding support groups lease breast pumps on a monthly basis. A lactation consultant can teach you how to hand express breast milk, if you prefer not to use a pump.  CARING FOR YOUR BREASTS WHILE YOU BREASTFEED Nipples can become dry, cracked, and sore while breastfeeding. The following recommendations can  help keep your breasts moisturized and healthy:  Avoid using soap on your nipples.   Wear a supportive bra. Although not required, special nursing bras and tank tops are designed to allow access to your breasts for breastfeeding without taking off your entire bra or top. Avoid wearing underwire-style bras or extremely tight bras.  Air dry your nipples for 3-52minutes after each feeding.   Use only cotton bra pads to absorb leaked breast milk. Leaking of breast milk between feedings is normal.   Use lanolin on your nipples after breastfeeding. Lanolin helps to maintain your skin's normal moisture barrier. If you use pure lanolin, you Zimmerman not need to wash it off before feeding your baby again. Pure lanolin is not toxic to your baby. You may also hand express a few drops of breast milk and gently massage that milk into your nipples and allow the milk to air dry. In the first few weeks after giving birth, some women experience extremely full breasts (engorgement). Engorgement can make your breasts feel heavy, warm, and tender to the touch. Engorgement peaks within 3-5 days after you give birth. The following recommendations can help ease engorgement:  Completely empty your breasts while breastfeeding or pumping. You may want to start by applying warm, moist heat (in the shower or with warm water-soaked hand towels) just before feeding or pumping. This increases circulation and helps the milk flow. If your baby does not completely empty your breasts while breastfeeding, pump any extra milk after he or she is finished.  Wear a snug bra (nursing or regular) or tank top for 1-2 days to signal your body to slightly decrease milk production.  Apply ice packs to your breasts, unless this is too uncomfortable for you.  Make sure that your baby is latched on and positioned properly while breastfeeding. If engorgement persists after 48 hours of following these recommendations, contact your health care  provider or a Advertising copywriter. OVERALL HEALTH CARE RECOMMENDATIONS WHILE BREASTFEEDING  Eat healthy foods. Alternate between meals and snacks, eating 3 of each per day. Because what you eat affects your breast milk, some of the foods may make your baby more irritable than usual. Avoid eating these foods if you are sure that they are negatively affecting your baby.  Drink milk, fruit juice, and water to satisfy your thirst (about 10 glasses a day).   Rest often, relax, and continue to take your prenatal vitamins to prevent fatigue, stress, and anemia.  Continue breast self-awareness checks.  Avoid chewing and smoking tobacco.  Avoid alcohol and drug use. Some medicines that may be harmful to your baby can pass through breast milk. It is important to ask your health care provider before taking any medicine, including all over-the-counter and prescription medicine as well as vitamin and herbal supplements.  It is possible to become pregnant while breastfeeding. If birth control is desired, ask your health care provider about options that will be safe for your baby. SEEK MEDICAL CARE IF:   You feel like you want to stop breastfeeding or have become frustrated with breastfeeding.  You have painful breasts or nipples.  Your nipples are cracked or bleeding.  Your breasts are red, tender, or warm.  You have a swollen area on either breast.  You have a fever or chills.  You have nausea or vomiting.  You have drainage other than breast milk from your nipples.  Your breasts Zimmerman not become full before feedings by the fifth day after you give birth.  You feel sad and depressed.  Your baby is too sleepy to eat well.  Your baby is having trouble sleeping.   Your baby is wetting less than 3 diapers in a 24-hour period.  Your baby has less than 3 stools in a 24-hour period.  Your baby's skin or the white part of his or her eyes becomes yellow.   Your baby is not gaining weight by  64 days of age. SEEK IMMEDIATE MEDICAL CARE IF:   Your baby is overly tired (lethargic) and does not want to wake up and feed.  Your baby develops an unexplained fever. Document Released: 05/29/2005 Document Revised: 06/03/2013 Document Reviewed: 11/20/2012 [facility] Patient Information 2015 [place], [place]. This information is not intended to replace advice given to you by your health care provider. Make sure you discuss any questions you have with your health care provider.

## 2014-08-22 LAB — TYPE AND SCREEN
ABO/RH(D): AB POS
Antibody Screen: NEGATIVE
Unit division: 0
Unit division: 0

## 2014-09-01 ENCOUNTER — Inpatient Hospital Stay (HOSPITAL_COMMUNITY)
Admission: AD | Admit: 2014-09-01 | Discharge: 2014-09-01 | Disposition: A | Discharge status: Home / Self Care | Payer: Medicaid Other | Source: Ambulatory Visit | Attending: Obstetrics and Gynecology | Admitting: Obstetrics and Gynecology

## 2014-09-01 ENCOUNTER — Other Ambulatory Visit (HOSPITAL_COMMUNITY): Payer: Self-pay | Admitting: Certified Nurse Midwife

## 2014-09-01 ENCOUNTER — Encounter (HOSPITAL_COMMUNITY): Payer: Self-pay | Admitting: *Deleted

## 2014-09-01 DIAGNOSIS — R030 Elevated blood-pressure reading, without diagnosis of hypertension: Secondary | ICD-10-CM | POA: Diagnosis not present

## 2014-09-01 DIAGNOSIS — O9089 Other complications of the puerperium, not elsewhere classified: Secondary | ICD-10-CM | POA: Insufficient documentation

## 2014-09-01 DIAGNOSIS — R51 Headache: Secondary | ICD-10-CM | POA: Diagnosis present

## 2014-09-01 DIAGNOSIS — I159 Secondary hypertension, unspecified: Secondary | ICD-10-CM

## 2014-09-01 LAB — CBC WITH DIFFERENTIAL/PLATELET
Basophils Absolute: 0 10*3/uL (ref 0.0–0.1)
Basophils Relative: 1 % (ref 0–1)
EOS PCT: 1 % (ref 0–5)
Eosinophils Absolute: 0.1 10*3/uL (ref 0.0–0.7)
HCT: 34.6 % — ABNORMAL LOW (ref 36.0–46.0)
Hemoglobin: 12.1 g/dL (ref 12.0–15.0)
LYMPHS ABS: 1.3 10*3/uL (ref 0.7–4.0)
Lymphocytes Relative: 30 % (ref 12–46)
MCH: 30.5 pg (ref 26.0–34.0)
MCHC: 35 g/dL (ref 30.0–36.0)
MCV: 87.2 fL (ref 78.0–100.0)
MONOS PCT: 8 % (ref 3–12)
Monocytes Absolute: 0.3 10*3/uL (ref 0.1–1.0)
NEUTROS PCT: 60 % (ref 43–77)
Neutro Abs: 2.6 10*3/uL (ref 1.7–7.7)
PLATELETS: 196 10*3/uL (ref 150–400)
RBC: 3.97 MIL/uL (ref 3.87–5.11)
RDW: 12.5 % (ref 11.5–15.5)
WBC: 4.3 10*3/uL (ref 4.0–10.5)

## 2014-09-01 LAB — URINALYSIS, ROUTINE W REFLEX MICROSCOPIC
BILIRUBIN URINE: NEGATIVE
Glucose, UA: NEGATIVE mg/dL
Ketones, ur: NEGATIVE mg/dL
Nitrite: NEGATIVE
PROTEIN: NEGATIVE mg/dL
Specific Gravity, Urine: 1.015 (ref 1.005–1.030)
UROBILINOGEN UA: 0.2 mg/dL (ref 0.0–1.0)
pH: 6.5 (ref 5.0–8.0)

## 2014-09-01 LAB — COMPREHENSIVE METABOLIC PANEL
ALK PHOS: 92 U/L (ref 39–117)
ALT: 43 U/L — ABNORMAL HIGH (ref 0–35)
AST: 29 U/L (ref 0–37)
Albumin: 3.4 g/dL — ABNORMAL LOW (ref 3.5–5.2)
Anion gap: 6 (ref 5–15)
BUN: 11 mg/dL (ref 6–23)
CALCIUM: 9 mg/dL (ref 8.4–10.5)
CO2: 26 mmol/L (ref 19–32)
Chloride: 107 mmol/L (ref 96–112)
Creatinine, Ser: 0.73 mg/dL (ref 0.50–1.10)
GFR calc Af Amer: >90 mL/min (ref >90)
GFR calc non Af Amer: >90 mL/min (ref >90)
Glucose, Bld: 87 mg/dL (ref 70–99)
Potassium: 4.2 mmol/L (ref 3.5–5.1)
Sodium: 139 mmol/L (ref 135–145)
TOTAL PROTEIN: 6.6 g/dL (ref 6.0–8.3)
Total Bilirubin: 0.3 mg/dL (ref 0.3–1.2)

## 2014-09-01 LAB — PROTEIN / CREATININE RATIO, URINE
Creatinine, Urine: 88 mg/dL
Protein Creatinine Ratio: 0.07 (ref 0.00–0.15)
TOTAL PROTEIN, URINE: 6 mg/dL

## 2014-09-01 LAB — URIC ACID: URIC ACID, SERUM: 6.2 mg/dL (ref 2.4–7.0)

## 2014-09-01 LAB — LACTATE DEHYDROGENASE: LDH: 272 U/L — ABNORMAL HIGH (ref 94–250)

## 2014-09-01 LAB — URINE MICROSCOPIC-ADD ON

## 2014-09-01 MED ORDER — LACTATED RINGERS IV SOLN
INTRAVENOUS | Status: DC
  Administered 2014-09-01 (×2): via INTRAVENOUS

## 2014-09-01 MED ORDER — NIFEDIPINE ER OSMOTIC RELEASE 30 MG PO TB24
30.0000 mg | ORAL_TABLET | Freq: Every day | ORAL | Status: DC
  Administered 2014-09-01: 30 mg via ORAL
  Filled 2014-09-01: qty 1

## 2014-09-01 MED ORDER — PROCHLORPERAZINE EDISYLATE 5 MG/ML IJ SOLN
10.0000 mg | Freq: Once | INTRAMUSCULAR | Status: AC
  Administered 2014-09-01: 10 mg via INTRAVENOUS
  Filled 2014-09-01: qty 2

## 2014-09-01 MED ORDER — HYDRALAZINE HCL 20 MG/ML IJ SOLN: 10.0000 mg | Freq: Once | INTRAMUSCULAR | Status: DC

## 2014-09-01 MED ORDER — HYDRALAZINE HCL 20 MG/ML IJ SOLN
5.0000 mg | Freq: Once | INTRAMUSCULAR | Status: AC
  Administered 2014-09-01: 5 mg via INTRAVENOUS
  Filled 2014-09-01: qty 1

## 2014-09-01 MED ORDER — DIPHENHYDRAMINE HCL 50 MG/ML IJ SOLN
25.0000 mg | Freq: Once | INTRAMUSCULAR | Status: AC
  Administered 2014-09-01: 25 mg via INTRAVENOUS
  Filled 2014-09-01: qty 1

## 2014-09-01 MED ORDER — NIFEDIPINE ER 30 MG PO TB24: 30.0000 mg | ORAL_TABLET | Freq: Every day | ORAL | Status: DC

## 2014-09-01 MED ORDER — DEXAMETHASONE SODIUM PHOSPHATE 10 MG/ML IJ SOLN
10.0000 mg | Freq: Once | INTRAMUSCULAR | Status: AC
  Administered 2014-09-01: 10 mg via INTRAVENOUS
  Filled 2014-09-01: qty 1

## 2014-09-01 NOTE — Discharge Instructions (Signed)
Hypertension Hypertension is another name for high blood pressure. High blood pressure forces your heart to work harder to pump blood. A blood pressure reading has two numbers, which includes a higher number over a lower number (example: 110/72). HOME CARE   Have your blood pressure rechecked by your doctor.  Only take medicine as told by your doctor. Follow the directions carefully. The medicine does not work as well if you skip doses. Skipping doses also puts you at risk for problems.  Do not smoke.  Monitor your blood pressure at home as told by your doctor. GET HELP IF:  You think you are having a reaction to the medicine you are taking.  You have repeat headaches or feel dizzy.  You have puffiness (swelling) in your ankles.  You have trouble with your vision. GET HELP RIGHT AWAY IF:   You get a very bad headache and are confused.  You feel weak, numb, or faint.  You get chest or belly (abdominal) pain.  You throw up (vomit).  You cannot breathe very well. MAKE SURE YOU:   Understand these instructions.  Will watch your condition.  Will get help right away if you are not doing well or get worse. Document Released: 11/15/2007 Document Revised: 06/03/2013 Document Reviewed: 03/21/2013 [facility] Patient Information 2015 [place]. This information is not intended to replace advice given to you by your health care provider. Make sure you discuss any questions you have with your health care provider. Cluster Headache Cluster headaches are recognized by their pattern of deep, intense head pain. They normally occur on one side of your head, but they may "switch sides" in subsequent episodes. Typically, cluster headaches:   Are severe in nature.   Occur repeatedly over weeks to months and are followed by periods of no headaches.   Can last from 15 minutes to 3 hours.   Occur at the same time each day, often at night.   Occur several times a day. CAUSES The  exact cause of cluster headaches is not known. Alcohol use may be associated with cluster headaches. SIGNS AND SYMPTOMS   Severe pain that begins in or around your eye or temple.   One-sided head pain.   Feeling sick to your stomach (nauseous).   Sensitivity to light.   Runny nose.   Eye redness, tearing, and nasal stuffiness on the side of your head where you are experiencing pain.   Sweaty, pale skin of the face.   Droopy or swollen eyelid.   Restlessness. DIAGNOSIS  Cluster headaches are diagnosed based on symptoms and a physical exam. Your health care provider may order a CT scan or an MRI of your head or lab tests to see if your headaches are caused by other medical conditions.  TREATMENT   Medicines for pain relief and to prevent recurrent attacks. Some people may need a combination of medicines.  Oxygen for pain relief.   Biofeedback programs to help reduce headache pain.  It may be helpful to keep a headache diary. This may help you find a trend for what is triggering your headaches. Your health care provider can develop a treatment plan.  HOME CARE INSTRUCTIONS  During cluster periods:   Follow a regular sleep schedule. Do not vary the amount and time that you sleep from day to day. It is important to stay on the same schedule during a cluster period to help prevent headaches.   Avoid alcohol.   Stop smoking if you smoke.  SEEK  MEDICAL CARE IF:  You have any changes from your previous cluster headaches either in intensity or frequency.   You are not getting relief from medicines you are taking.  SEEK IMMEDIATE MEDICAL CARE IF:   You faint.   You have weakness or numbness, especially on one side of your body or face.   You have double vision.   You have nausea or vomiting that is not relieved within several hours.   You cannot keep your balance or have difficulty talking or walking.   You have neck pain or stiffness.   You have a  fever. MAKE SURE YOU:  Understand these instructions.   Will watch your condition.   Will get help right away if you are not doing well or get worse. Document Released: 05/29/2005 Document Revised: 03/19/2013 Document Reviewed: 12/19/2012 [facility] Patient Information 2015 [place], [place]. This information is not intended to replace advice given to you by your health care provider. Make sure you discuss any questions you have with your health care provider.

## 2014-09-01 NOTE — MAU Provider Note (Signed)
Addendum  MAU Addendum Note  Results for orders placed or performed during the hospital encounter of 09/01/14 (from the past 24 hour(s))  Urinalysis, Routine w reflex microscopic     Status: Abnormal   Collection Time: 09/01/14 10:13 AM  Result Value Ref Range   Color, Urine YELLOW YELLOW   APPearance CLEAR CLEAR   Specific Gravity, Urine 1.015 1.005 - 1.030   pH 6.5 5.0 - 8.0   Glucose, UA NEGATIVE NEGATIVE mg/dL   Hgb urine dipstick MODERATE (A) NEGATIVE   Bilirubin Urine NEGATIVE NEGATIVE   Ketones, ur NEGATIVE NEGATIVE mg/dL   Protein, ur NEGATIVE NEGATIVE mg/dL   Urobilinogen, UA 0.2 0.0 - 1.0 mg/dL   Nitrite NEGATIVE NEGATIVE   Leukocytes, UA TRACE (A) NEGATIVE  Protein / creatinine ratio, urine     Status: None   Collection Time: 09/01/14 10:13 AM  Result Value Ref Range   Creatinine, Urine 88.00 mg/dL   Total Protein, Urine 6 mg/dL   Protein Creatinine Ratio 0.07 0.00 - 0.15  Urine microscopic-add on     Status: None   Collection Time: 09/01/14 10:13 AM  Result Value Ref Range   WBC, UA 0-2 <3 WBC/hpf   RBC / HPF 0-2 <3 RBC/hpf   Bacteria, UA RARE RARE  CBC with Differential     Status: Abnormal   Collection Time: 09/01/14 10:39 AM  Result Value Ref Range   WBC 4.3 4.0 - 10.5 K/uL   RBC 3.97 3.87 - 5.11 MIL/uL   Hemoglobin 12.1 12.0 - 15.0 g/dL   HCT 91.4 (L) 78.2 - 95.6 %   MCV 87.2 78.0 - 100.0 fL   MCH 30.5 26.0 - 34.0 pg   MCHC 35.0 30.0 - 36.0 g/dL   RDW 21.3 08.6 - 57.8 %   Platelets 196 150 - 400 K/uL   Neutrophils Relative % 60 43 - 77 %   Neutro Abs 2.6 1.7 - 7.7 K/uL   Lymphocytes Relative 30 12 - 46 %   Lymphs Abs 1.3 0.7 - 4.0 K/uL   Monocytes Relative 8 3 - 12 %   Monocytes Absolute 0.3 0.1 - 1.0 K/uL   Eosinophils Relative 1 0 - 5 %   Eosinophils Absolute 0.1 0.0 - 0.7 K/uL   Basophils Relative 1 0 - 1 %   Basophils Absolute 0.0 0.0 - 0.1 K/uL  Uric acid     Status: None   Collection Time: 09/01/14 11:03 AM  Result Value Ref Range   Uric  Acid, Serum 6.2 2.4 - 7.0 mg/dL  Comprehensive metabolic panel     Status: Abnormal   Collection Time: 09/01/14 11:03 AM  Result Value Ref Range   Sodium 139 135 - 145 mmol/L   Potassium 4.2 3.5 - 5.1 mmol/L   Chloride 107 96 - 112 mmol/L   CO2 26 19 - 32 mmol/L   Glucose, Bld 87 70 - 99 mg/dL   BUN 11 6 - 23 mg/dL   Creatinine, Ser 4.69 0.50 - 1.10 mg/dL   Calcium 9.0 8.4 - 62.9 mg/dL   Total Protein 6.6 6.0 - 8.3 g/dL   Albumin 3.4 (L) 3.5 - 5.2 g/dL   AST 29 0 - 37 U/L   ALT 43 (H) 0 - 35 U/L   Alkaline Phosphatase 92 39 - 117 U/L   Total Bilirubin 0.3 0.3 - 1.2 mg/dL   GFR calc non Af Amer >90 >90 mL/min   GFR calc Af Amer >90 >90 mL/min   Anion  gap 6 5 - 15  Lactate dehydrogenase     Status: Abnormal   Collection Time: 09/01/14 11:03 AM  Result Value Ref Range   LDH 272 (H) 94 - 250 U/L   Filed Vitals:   09/01/14 1316 09/01/14 1330 09/01/14 1346 09/01/14 1408  BP: 188/94 206/101 198/96 177/77  Pulse:    51  Temp:      TempSrc:      Resp:        Plan: Hydralazine 5mg  IVP wait 20 minutes if BP still elevated 10mg  IVP Procardia xl 30mg  Consulted with Dr. Maureen Ralphsillard   Pearlene Teat, CNM, MSN 09/01/2014. 2:42 PM

## 2014-09-01 NOTE — MAU Provider Note (Signed)
Diane Zimmerman is a 34 y.o. G6P1 SP SVD on 3/9   History     Patient Active Problem List   Diagnosis Date Noted  . SVD (spontaneous vaginal delivery) 08/19/2014  . Second-degree perineal laceration, with delivery 08/19/2014    Chief Complaint  Patient presents with  . Headache  . Hypertension   HPI  OB History    Gravida Para Term Preterm AB TAB SAB Ectopic Multiple Living   6 3 1  2  2   0 3      Past Medical History  Diagnosis Date  . Thrombocytopenia     Past Surgical History  Procedure Laterality Date  . No past surgeries      Family History  Problem Relation Age of Onset  . Alcohol abuse Neg Hx   . Arthritis Neg Hx   . Asthma Neg Hx   . Birth defects Neg Hx   . Cancer Neg Hx   . COPD Neg Hx   . Depression Neg Hx   . Diabetes Neg Hx   . Drug abuse Neg Hx   . Early death Neg Hx   . Hearing loss Neg Hx   . Heart disease Neg Hx   . Hyperlipidemia Neg Hx   . Hypertension Neg Hx   . Kidney disease Neg Hx   . Learning disabilities Neg Hx   . Mental illness Neg Hx   . Mental retardation Neg Hx   . Miscarriages / Stillbirths Neg Hx   . Stroke Neg Hx   . Vision loss Neg Hx   . Varicose Veins Neg Hx     History  Substance Use Topics  . Smoking status: Never Smoker   . Smokeless tobacco: Not on file  . Alcohol Use: No    Allergies: No Known Allergies  Prescriptions prior to admission  Medication Sig Dispense Refill Last Dose  . Butalbital-APAP-Caffeine 50-300-40 MG CAPS Take 1 capsule by mouth every 4 (four) hours as needed (headaches).   08/31/2014 at Unknown time  . Docosahexaenoic Acid (DHA PO) Take 1 capsule by mouth daily.   08/31/2014 at Unknown time  . FeFum-FePoly-FA-B Cmp-C-Biot (INTEGRA PLUS PO) Take 1 capsule by mouth every other day.    08/31/2014 at Unknown time  . ferrous sulfate 325 (65 FE) MG EC tablet Take 1 tablet (325 mg total) by mouth 2 (two) times daily. 60 tablet 3 08/31/2014 at Unknown time  . ibuprofen (ADVIL,MOTRIN)  600 MG tablet Take 1 tablet (600 mg total) by mouth every 6 (six) hours. (Patient taking differently: Take 600 mg by mouth every 6 (six) hours as needed for fever or moderate pain. ) 30 tablet 0 08/31/2014 at Unknown time  . oxyCODONE-acetaminophen (PERCOCET/ROXICET) 5-325 MG per tablet Take 1 tablet by mouth every 4 (four) hours as needed (for pain scale less than 7). 30 tablet 0 08/31/2014 at Unknown time  . Prenatal Vit-Fe Fumarate-FA (PRENATAL MULTIVITAMIN) TABS tablet Take 1 tablet by mouth daily at 12 noon.   08/31/2014 at Unknown time    ROS See HPI above, all other systems are negative  Physical Exam   Blood pressure 148/79, pulse 63, temperature 98.9 F (37.2 C), temperature source Oral, resp. rate 18, last menstrual period 11/06/2013, currently breastfeeding. Filed Vitals:   09/01/14 1010 09/01/14 1025  BP: 129/79 148/79  Pulse: 72 63  Temp: 98.9 F (37.2 C)   TempSrc: Oral   Resp: 18 18    Physical Exam Ext:  WNL ABD:  Soft, non tender to palpation, no rebound or guarding    ED Course  Assessment: Acute HA   Plan: Labs: CBC, LDH, CMP, uric acid and  PCR MAU HA protocol, benadryl, decadrone and compazine, IVF and O2   Frankey Botting, CNM, MSN 09/01/2014. 11:42 AM

## 2014-09-01 NOTE — MAU Note (Addendum)
Del 03/09- vag delivered.  No BP problems with preg. Home nurse said BP was up.  Has had a HA past 2 days, took Ibuprofen, did not help.  Visual changes also reported.  baby is doing well, breast feeding, milk is in

## 2014-09-01 NOTE — MAU Note (Signed)
Delivered vaginally on 08-19-14; c/o headache (pain starts at the top of pt's head and comes forward to pt's eyes; pt has used Ibuprofen 600 mg and Fioricet for the headache without any relief; last headache med was taken last night; serial BPs in progress;

## 2014-09-15 ENCOUNTER — Ambulatory Visit (INDEPENDENT_AMBULATORY_CARE_PROVIDER_SITE_OTHER): Payer: Medicaid Other | Admitting: Diagnostic Neuroimaging

## 2014-09-15 ENCOUNTER — Encounter: Payer: Self-pay | Admitting: Diagnostic Neuroimaging

## 2014-09-15 VITALS — BP 120/71 | HR 69 | Ht 64.0 in | Wt 156.4 lb

## 2014-09-15 DIAGNOSIS — R51 Headache: Secondary | ICD-10-CM

## 2014-09-15 DIAGNOSIS — O9089 Other complications of the puerperium, not elsewhere classified: Secondary | ICD-10-CM | POA: Diagnosis not present

## 2014-09-15 DIAGNOSIS — R519 Headache, unspecified: Secondary | ICD-10-CM

## 2014-09-15 NOTE — Patient Instructions (Signed)
Monitor symptoms.   If headache worsens, then call us back and we may check additional testing.

## 2014-09-15 NOTE — Progress Notes (Signed)
[facility]  PATIENT: Diane Zimmerman DOB: 11-02-1980  REFERRING CLINICIAN: Alessandra Grout, FNP HISTORY FROM: patient  REASON FOR VISIT: new consult    HISTORICAL  CHIEF COMPLAINT:  Chief Complaint  Patient presents with  . New Evaluation    generalized headache     HISTORY OF PRESENT ILLNESS:   34 year old right-handed female here for evaluation of headache. Patient delivered her third child on 08/19/14, spontaneous vaginal delivery with no competition. Patient was discharged in good health. On 08/29/14 patient developed onset of vertex, midline headache with eye pain, neck pain. She went back to [facility] hospital for evaluation and was noted to have elevated blood pressure. Patient was treated with headache cocktail medications and prescribed Fioricet. No nausea, vomiting, photophobia or phonophobia. No visual changes. No numbness or tingling in extremities. Patient was then referred to me for consultation of this problem by Montez Morita FNP.  Since that time symptoms have gradually improved. Today she has very low-grade pain 3 out of 10. Day by day symptoms are improving since the peak pain she felt on 09/01/14.    REVIEW OF SYSTEMS: Full 14 system review of systems performed and notable only for headache.  ALLERGIES: No Known Allergies  HOME MEDICATIONS: Outpatient Prescriptions Prior to Visit  Medication Sig Dispense Refill  . Butalbital-APAP-Caffeine 50-300-40 MG CAPS Take 1 capsule by mouth every 4 (four) hours as needed (headaches).    . Docosahexaenoic Acid (DHA PO) Take 1 capsule by mouth daily.    Marland Kitchen FeFum-FePoly-FA-B Cmp-C-Biot (INTEGRA PLUS PO) Take 1 capsule by mouth every other day.     . ferrous sulfate 325 (65 FE) MG EC tablet Take 1 tablet (325 mg total) by mouth 2 (two) times daily. 60 tablet 3  . ibuprofen (ADVIL,MOTRIN) 600 MG tablet Take 1 tablet (600 mg total) by mouth every 6 (six) hours. (Patient taking differently: Take 600 mg by mouth  every 6 (six) hours as needed for fever or moderate pain. ) 30 tablet 0  . NIFEdipine (PROCARDIA-XL/ADALAT CC) 30 MG 24 hr tablet Take 1 tablet (30 mg total) by mouth daily. 30 tablet 1  . oxyCODONE-acetaminophen (PERCOCET/ROXICET) 5-325 MG per tablet Take 1 tablet by mouth every 4 (four) hours as needed (for pain scale less than 7). 30 tablet 0  . Prenatal Vit-Fe Fumarate-FA (PRENATAL MULTIVITAMIN) TABS tablet Take 1 tablet by mouth daily at 12 noon.     No facility-administered medications prior to visit.    PAST MEDICAL HISTORY: Past Medical History  Diagnosis Date  . Thrombocytopenia   . Gave birth to child recently     PAST SURGICAL HISTORY: Past Surgical History  Procedure Laterality Date  . No past surgeries      FAMILY HISTORY: Family History  Problem Relation Age of Onset  . Alcohol abuse Neg Hx   . Arthritis Neg Hx   . Asthma Neg Hx   . Birth defects Neg Hx   . Cancer Neg Hx   . COPD Neg Hx   . Depression Neg Hx   . Diabetes Neg Hx   . Drug abuse Neg Hx   . Early death Neg Hx   . Hearing loss Neg Hx   . Heart disease Neg Hx   . Hyperlipidemia Neg Hx   . Hypertension Neg Hx   . Kidney disease Neg Hx   . Learning disabilities Neg Hx   . Mental illness Neg Hx   . Mental retardation Neg Hx   . Miscarriages /  Stillbirths Neg Hx   . Stroke Neg Hx   . Vision loss Neg Hx   . Varicose Veins Neg Hx   . Healthy Mother     SOCIAL HISTORY:  History   Social History  . Marital Status: Married    Spouse Name: Teacher, early years/pre  . Number of Children: 3  . Years of Education: 12   Occupational History  . Not on file.   Social History Main Topics  . Smoking status: Never Smoker   . Smokeless tobacco: Not on file  . Alcohol Use: No  . Drug Use: No  . Sexual Activity: Not on file   Other Topics Concern  . Not on file   Social History Narrative   Lives with husband and three children    Drinks tea     PHYSICAL EXAM  Filed Vitals:   09/15/14 0849  09/15/14 0850  BP:  120/71  Pulse:  69  Height:  (1.626 m)   Weight: 156 lb 6.4 oz (70.943 kg)     Body mass index is 26.83 kg/(m^2).   Visual Acuity Screening   Right eye Left eye Both eyes  Without correction: 20/100 20/100   With correction:       No flowsheet data found.  GENERAL EXAM: Patient is in no distress; well developed, nourished and groomed; neck is supple  CARDIOVASCULAR: Regular rate and rhythm, no murmurs, no carotid bruits  NEUROLOGIC: MENTAL STATUS: awake, alert, oriented to person, place and time, recent and remote memory intact, normal attention and concentration, language fluent, comprehension intact, naming intact, fund of knowledge appropriate CRANIAL NERVE: no papilledema on fundoscopic exam, pupils equal and reactive to light, visual fields full to confrontation, extraocular muscles intact, no nystagmus, facial sensation and strength symmetric, hearing intact, palate elevates symmetrically, uvula midline, shoulder shrug symmetric, tongue midline. MOTOR: normal bulk and tone, full strength in the BUE, BLE SENSORY: normal and symmetric to light touch, pinprick, temperature, vibration  COORDINATION: finger-nose-finger, fine finger movements, heel-shin normal REFLEXES: deep tendon reflexes present and symmetric GAIT/STATION: narrow based gait; able to walk on toes, heels and tandem; romberg is negative    DIAGNOSTIC DATA (LABS, IMAGING, TESTING) - I reviewed patient records, labs, notes, testing and imaging myself where available.  Lab Results  Component Value Date   WBC 4.3 09/01/2014   HGB 12.1 09/01/2014   HCT 34.6* 09/01/2014   MCV 87.2 09/01/2014   PLT 196 09/01/2014      Component Value Date/Time   NA 139 09/01/2014 1103   K 4.2 09/01/2014 1103   CL 107 09/01/2014 1103   CO2 26 09/01/2014 1103   GLUCOSE 87 09/01/2014 1103   BUN 11 09/01/2014 1103   CREATININE 0.73 09/01/2014 1103   CALCIUM 9.0 09/01/2014 1103   PROT 6.6 09/01/2014  1103   ALBUMIN 3.4* 09/01/2014 1103   AST 29 09/01/2014 1103   ALT 43* 09/01/2014 1103   ALKPHOS 92 09/01/2014 1103   BILITOT 0.3 09/01/2014 1103   GFRNONAA >90 09/01/2014 1103   GFRAA >90 09/01/2014 1103   No results found for: CHOL, HDL, LDLCALC, LDLDIRECT, TRIG, CHOLHDL No results found for: ZOXW9U No results found for: VITAMINB12 No results found for: TSH     ASSESSMENT AND PLAN  34 y.o. year old female here with new onset post-partum headache since 08/29/14. HA spontaneously improving day by day. Neuro exam normal. No other associated neurologic symptoms. Most likely represents a benign postpartum headache, which could be related to  fluid shifts, dehydration, sleep deprivation. No red flags to suggest more ominous cause such as pituitary apoplexy, venous sinus thrombosis, infection or hemorrhage.  PLAN: - monitor symptoms - if HA increases or persists or changes, patient instructed to call us back; at that time we may consider MRI brain or other addl testing  Return if symptoms worsen or fail to improve.    Suanne MarkerVIKRAM R. Tarris Delbene, MD 09/15/2014, 9:09 AM Certified in Neurology, Neurophysiology and Neuroimaging  [facility] [address], [address] [place], [place] [address] [phone]

## 2015-03-30 ENCOUNTER — Other Ambulatory Visit: Payer: Self-pay | Admitting: Infectious Disease

## 2015-03-30 ENCOUNTER — Ambulatory Visit
Admission: RE | Admit: 2015-03-30 | Discharge: 2015-03-30 | Disposition: A | Discharge status: Home / Self Care | Payer: No Typology Code available for payment source | Source: Ambulatory Visit | Attending: Infectious Disease | Admitting: Infectious Disease

## 2015-03-30 DIAGNOSIS — R7611 Nonspecific reaction to tuberculin skin test without active tuberculosis: Secondary | ICD-10-CM

## 2015-07-07 ENCOUNTER — Encounter (HOSPITAL_COMMUNITY): Payer: Self-pay | Admitting: *Deleted

## 2019-10-28 ENCOUNTER — Other Ambulatory Visit: Payer: Self-pay | Admitting: Family Medicine

## 2019-10-28 MED ORDER — ATOVAQUONE-PROGUANIL HCL 250-100 MG PO TABS: 1.0000 | ORAL_TABLET | Freq: Every day | ORAL | 0 refills | Status: DC

## 2019-10-28 NOTE — Progress Notes (Signed)
Patient presented with daughter, Vonzell Schlatter, with request for vaccinations and advice for upcoming travel to [place], [place].

## 2019-12-04 ENCOUNTER — Other Ambulatory Visit (HOSPITAL_COMMUNITY): Payer: Self-pay

## 2021-11-22 ENCOUNTER — Ambulatory Visit (HOSPITAL_COMMUNITY)
Admission: EM | Admit: 2021-11-22 | Discharge: 2021-11-22 | Disposition: A | Discharge status: Home / Self Care | Payer: Commercial Managed Care - HMO | Attending: Physician Assistant | Admitting: Physician Assistant

## 2021-11-22 ENCOUNTER — Encounter (HOSPITAL_COMMUNITY): Payer: Self-pay | Admitting: *Deleted

## 2021-11-22 ENCOUNTER — Ambulatory Visit (INDEPENDENT_AMBULATORY_CARE_PROVIDER_SITE_OTHER): Payer: Commercial Managed Care - HMO

## 2021-11-22 ENCOUNTER — Other Ambulatory Visit: Payer: Self-pay

## 2021-11-22 DIAGNOSIS — M7062 Trochanteric bursitis, left hip: Secondary | ICD-10-CM | POA: Diagnosis not present

## 2021-11-22 DIAGNOSIS — M25552 Pain in left hip: Secondary | ICD-10-CM

## 2021-11-22 MED ORDER — PREDNISONE 10 MG (21) PO TBPK: ORAL_TABLET | ORAL | 0 refills | Status: DC

## 2021-11-22 NOTE — Discharge Instructions (Signed)
Your x-ray showed no evidence of fracture or abnormalities.  I am concerned that you have inflammation of the trochanteric bursa (fluid-filled sac over your hip).  I would like you to take prednisone for inflammation.  Do not take NSAIDs including aspirin, ibuprofen/Advil, naproxen/Aleve.  If your symptoms are not improving quickly with this medication you need to follow-up with orthopedic provider.  Please call to schedule an appointment if needed.  If anything worsens and you have severe pain, fever, nausea, vomiting you need to be seen immediately.

## 2021-11-22 NOTE — ED Triage Notes (Signed)
Pt reports Lt hip pain on Sunday.

## 2021-11-22 NOTE — ED Provider Notes (Signed)
[facility]    CSN: [id number] Arrival date & time: 11/22/21  1724      History   Chief Complaint Chief Complaint  Patient presents with   Hip Pain    HPI Diane Zimmerman is a 41 y.o. female.   Patient presents today with a 3-day history of left lateral hip pain.  She denies any known injury or increase in activity prior to symptom onset.  She denies any recent falls or trauma.  She reports that pain is worse with palpation or movement, rated 8 on a 0-10 pain scale, described as sharp, no alleviating factors identified.  She has tried ibuprofen without improvement of symptoms.  She denies previous injury or surgery involving hip.  She is having difficulty with her daily activities including sleeping through the night and ambulating due to pain.  She reports pain does travel into her left leg but denies any numbness, weakness, paresthesias.  She is confident that she is not pregnant.    Past Medical History:  Diagnosis Date   Gave birth to child recently    Thrombocytopenia [facility])     Patient Active Problem List   Diagnosis Date Noted   SVD (spontaneous vaginal delivery) 08/19/2014   Second-degree perineal laceration, with delivery 08/19/2014    Past Surgical History:  Procedure Laterality Date   NO PAST SURGERIES      OB History     Gravida  6   Para  3   Term  1   Preterm      AB  2   Living  3      SAB  2   IAB      Ectopic      Multiple  0   Live Births  1            Home Medications    Prior to Admission medications   Medication Sig Start Date End Date Taking? Authorizing Provider  predniSONE (STERAPRED UNI-PAK 21 TAB) 10 MG (21) TBPK tablet As directed 11/22/21  Yes Matasha Smigelski K, PA-C  atovaquone-proguanil (MALARONE) 250-100 MG TABS tablet Take 1 tablet by mouth daily. 10/28/19   Simmons-Robinson, Makiera, MD  Butalbital-APAP-Caffeine 50-300-40 MG CAPS Take 1 capsule by mouth every 4 (four) hours as needed  (headaches).    [provider]  Docosahexaenoic Acid (DHA PO) Take 1 capsule by mouth daily.    [provider]  FeFum-FePoly-FA-B Cmp-C-Biot (INTEGRA PLUS PO) Take 1 capsule by mouth every other day.     [provider]  ferrous sulfate 325 (65 FE) MG EC tablet Take 1 tablet (325 mg total) by mouth 2 (two) times daily. 08/21/14 08/21/15  Standard, Venus, CNM  NIFEdipine (PROCARDIA-XL/ADALAT CC) 30 MG 24 hr tablet Take 1 tablet (30 mg total) by mouth daily. 09/01/14   Standard, Venus, CNM  oxyCODONE-acetaminophen (PERCOCET/ROXICET) 5-325 MG per tablet Take 1 tablet by mouth every 4 (four) hours as needed (for pain scale less than 7). 08/21/14   Standard, Venus, CNM  Prenatal Vit-Fe Fumarate-FA (PRENATAL MULTIVITAMIN) TABS tablet Take 1 tablet by mouth daily at 12 noon.    [provider]    Family History Family History  Problem Relation Age of Onset   Alcohol abuse Neg Hx    Arthritis Neg Hx    Asthma Neg Hx    Birth defects Neg Hx    Cancer Neg Hx    COPD Neg Hx    Depression Neg Hx  Diabetes Neg Hx    Drug abuse Neg Hx    Early death Neg Hx    Hearing loss Neg Hx    Heart disease Neg Hx    Hyperlipidemia Neg Hx    Hypertension Neg Hx    Kidney disease Neg Hx    Learning disabilities Neg Hx    Mental illness Neg Hx    Mental retardation Neg Hx    Miscarriages / Stillbirths Neg Hx    Stroke Neg Hx    Vision loss Neg Hx    Varicose Veins Neg Hx    Healthy Mother     Social History Social History   Tobacco Use   Smoking status: Never  Substance Use Topics   Alcohol use: No   Drug use: No     Allergies   Patient has no known allergies.   Review of Systems Review of Systems  Constitutional:  Positive for activity change. Negative for appetite change, fatigue and fever.  Musculoskeletal:  Positive for arthralgias and gait problem. Negative for joint swelling and myalgias.  Neurological:  Negative for weakness and numbness.      Physical Exam Triage Vital Signs ED Triage Vitals  Enc Vitals Group     BP 11/22/21 1817 (!) 155/87     Pulse Rate 11/22/21 1817 86     Resp 11/22/21 1817 16     Temp 11/22/21 1817 98.7 F (37.1 C)     Temp src --      SpO2 11/22/21 1817 100 %     Weight --      Height --      Head Circumference --      Peak Flow --      Pain Score 11/22/21 1815 8     Pain Loc --      Pain Edu? --      Excl. in GC? --    No data found.  Updated Vital Signs BP (!) 155/87   Pulse 86   Temp 98.7 F (37.1 C)   Resp 16   LMP 11/20/2021   SpO2 100%   Visual Acuity Right Eye Distance:   Left Eye Distance:   Bilateral Distance:    Right Eye Near:   Left Eye Near:    Bilateral Near:     Physical Exam Vitals reviewed.  Constitutional:      General: She is awake. She is not in acute distress.    Appearance: Normal appearance. She is well-developed. She is not ill-appearing.     Comments: Very pleasant female appears stated age in no acute distress sitting comfortably in exam room  HENT:     Head: Normocephalic and atraumatic.  Cardiovascular:     Rate and Rhythm: Normal rate and regular rhythm.     Heart sounds: Normal heart sounds, S1 normal and S2 normal. No murmur heard. Pulmonary:     Effort: Pulmonary effort is normal.     Breath sounds: Normal breath sounds. No wheezing, rhonchi or rales.     Comments: Clear to auscultation bilaterally Abdominal:     Palpations: Abdomen is soft.     Tenderness: There is no abdominal tenderness.  Musculoskeletal:     Left hip: Tenderness and bony tenderness present. No deformity. Decreased range of motion.     Left upper leg: No swelling or edema.     Comments: Left hip: Decreased active motion secondary to pain with flexion, extension, abduction, adduction.  Strength 5/5 bilateral lower extremities.  Foot neurovascularly intact.  Psychiatric:        Behavior: Behavior is cooperative.      UC Treatments / Results  Labs (all labs  ordered are listed, but only abnormal results are displayed) Labs Reviewed - No data to display  EKG   Radiology DG Hip Unilat With Pelvis 2-3 Views Left  Result Date: 11/22/2021 CLINICAL DATA:  Left hip pain EXAM: DG HIP (WITH OR WITHOUT PELVIS) 2-3V LEFT COMPARISON:  None Available. FINDINGS: There is no evidence of hip fracture or dislocation. There is no evidence of arthropathy or other focal bone abnormality. IMPRESSION: Negative. Electronically Signed   By: Charlett NoseKevin  Dover M.D.   On: 11/22/2021 19:02    Procedures Procedures (including critical care time)  Medications Ordered in UC Medications - No data to display  Initial Impression / Assessment and Plan / UC Course  I have reviewed the triage vital signs and the nursing notes.  Pertinent labs & imaging results that were available during my care of the patient were reviewed by me and considered in my medical decision making (see chart for details).     X-ray obtained given severity of pain which showed no osseous abnormalities.  Suspect that symptoms are most likely related to trochanteric bursitis.  Patient has difficulty taking NSAIDs and has already failed ibuprofen so we will try prednisone taper.  Discussed that she should not take NSAIDs with this medication due to risk of GI bleeding.  Can use Tylenol for breakthrough pain.  Recommended rest and conservative treatment measures.  If symptoms or not improving she is to follow-up with orthopedics and was given contact information for local provider with instruction to call to schedule an appointment if necessary.  Discussed that if she has any worsening symptoms she is to return for reevaluation.  Strict return precautions given.  Work excuse note provided.  Final Clinical Impressions(s) / UC Diagnoses   Final diagnoses:  Left hip pain  Trochanteric bursitis of left hip     Discharge Instructions      Your x-ray showed no evidence of fracture or abnormalities.  I am  concerned that you have inflammation of the trochanteric bursa (fluid-filled sac over your hip).  I would like you to take prednisone for inflammation.  Do not take NSAIDs including aspirin, ibuprofen/Advil, naproxen/Aleve.  If your symptoms are not improving quickly with this medication you need to follow-up with orthopedic provider.  Please call to schedule an appointment if needed.  If anything worsens and you have severe pain, fever, nausea, vomiting you need to be seen immediately.     ED Prescriptions     Medication Sig Dispense Auth. Provider   predniSONE (STERAPRED UNI-PAK 21 TAB) 10 MG (21) TBPK tablet As directed 21 tablet Hubbert Landrigan K, PA-C      I have reviewed the PDMP during this encounter.   Jeani HawkingRaspet, Delmar Dondero K, PA-C 11/22/21 1935

## 2023-02-08 IMAGING — DX DG HIP (WITH OR WITHOUT PELVIS) 2-3V*L*
3 series · 3 of 3 positions shown · non-contrast
Comparison: None Available.

CLINICAL DATA: Left hip pain

EXAM:
DG HIP (WITH OR WITHOUT PELVIS) 2-3V LEFT

[pelvis ap]
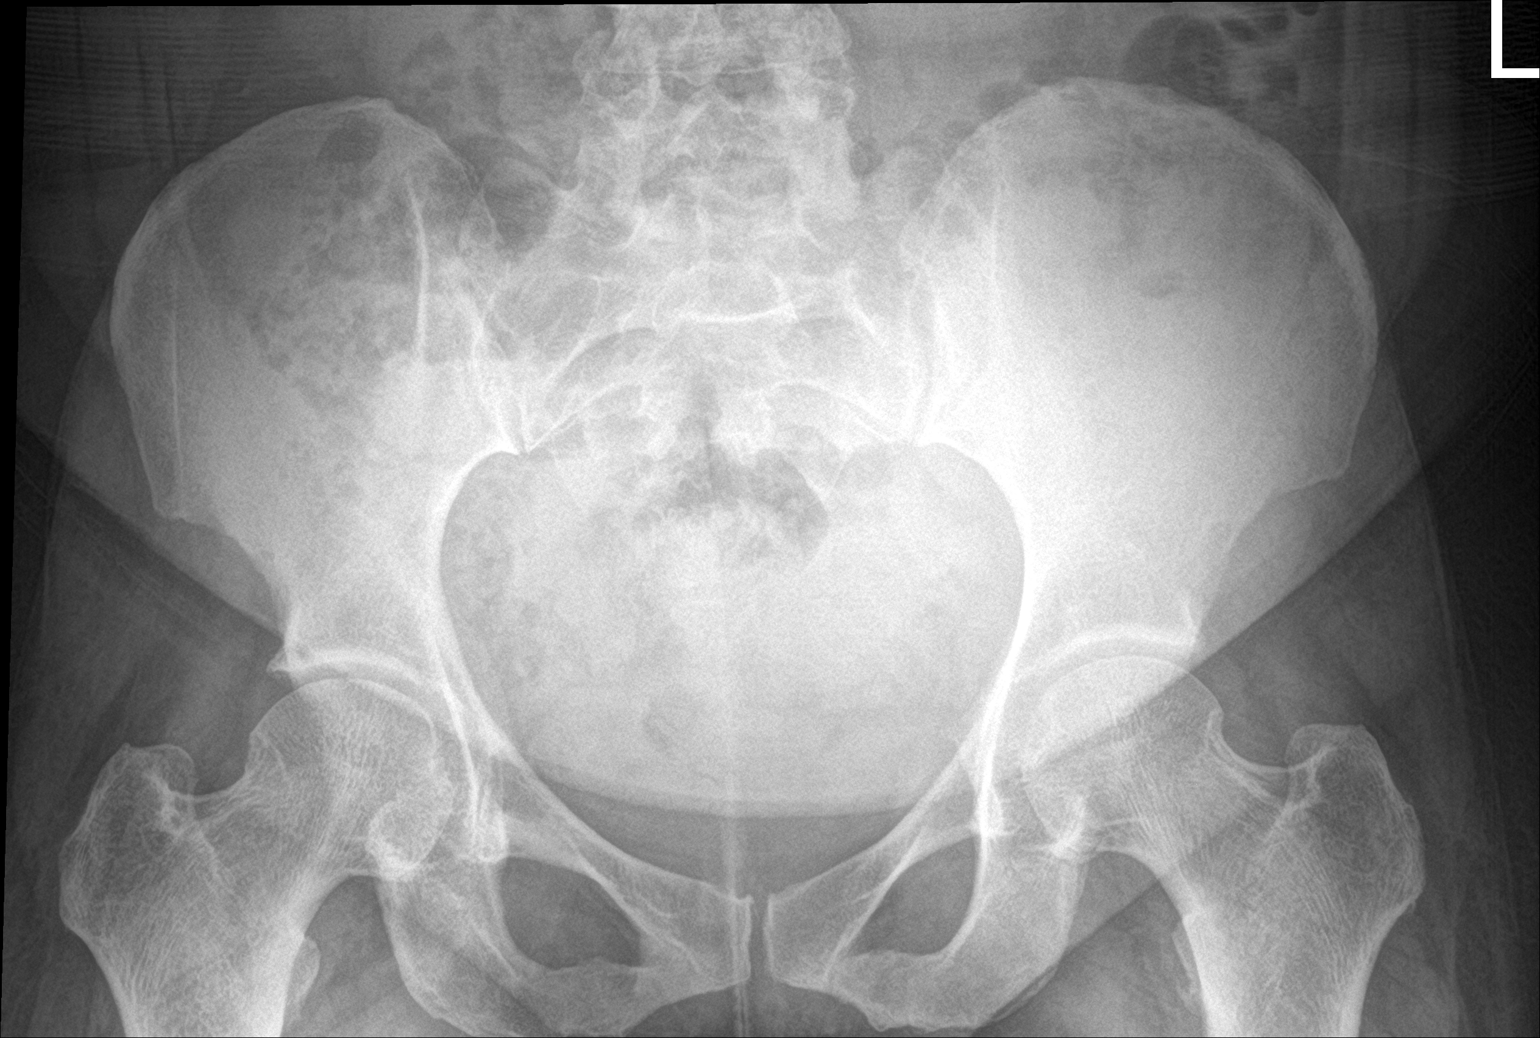

[hip ap]
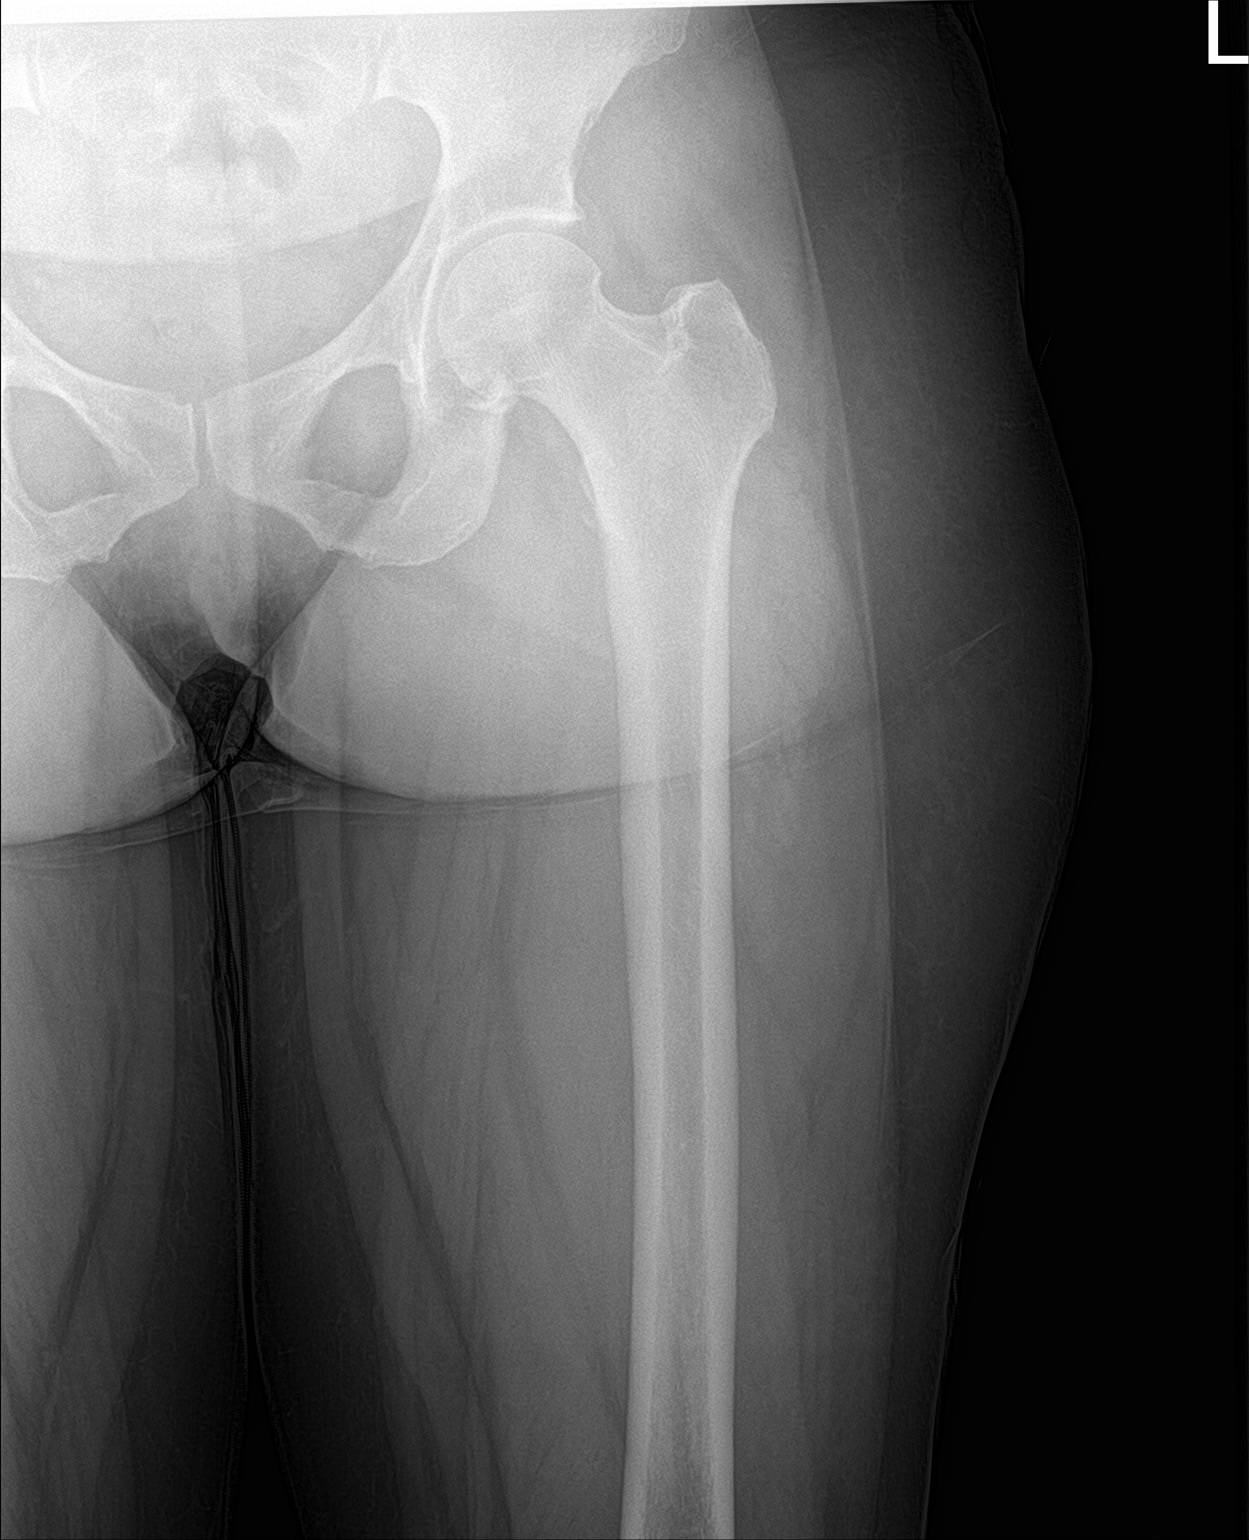

[hip lat]
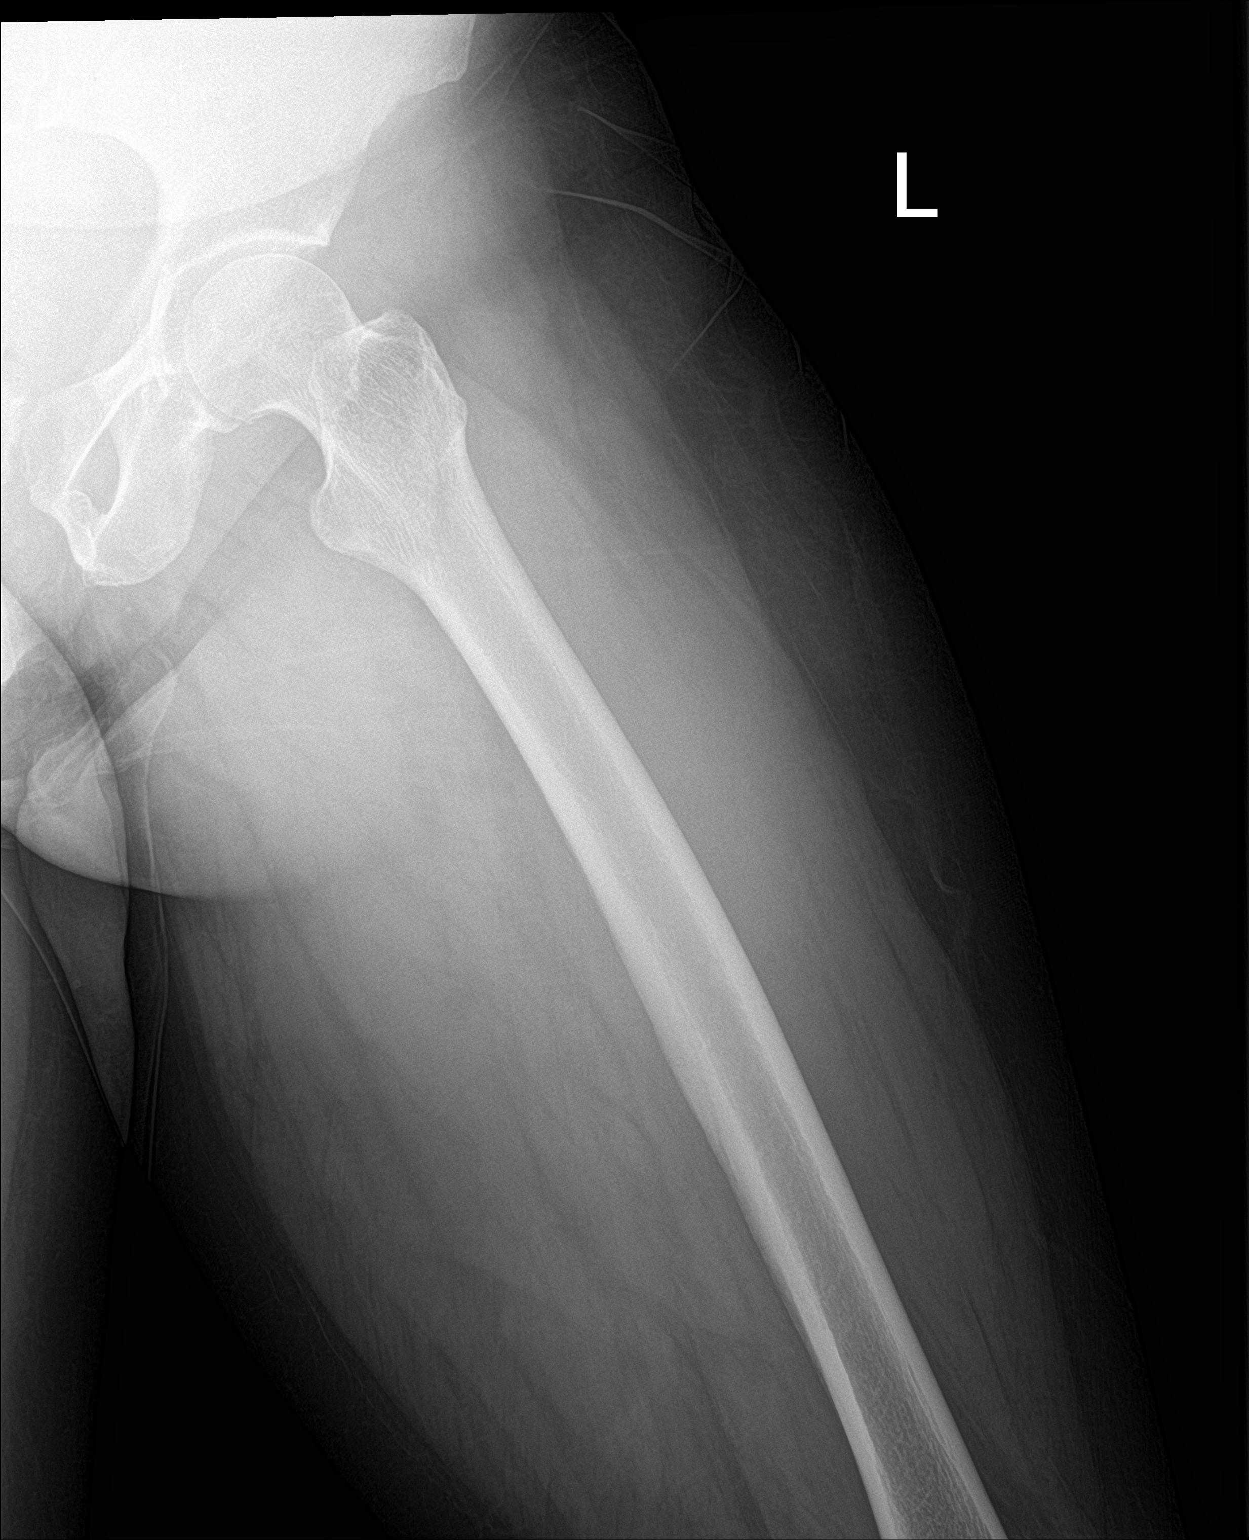

[3 of 3 positions shown; findings below may reference images not displayed]

FINDINGS: There is no evidence of hip fracture or dislocation. There is no
evidence of arthropathy or other focal bone abnormality.
IMPRESSION: Negative.

## 2023-02-24 ENCOUNTER — Ambulatory Visit (HOSPITAL_COMMUNITY)
Admission: EM | Admit: 2023-02-24 | Discharge: 2023-02-24 | Disposition: A | Discharge status: Home / Self Care | Payer: Commercial Managed Care - HMO | Attending: Emergency Medicine | Admitting: Emergency Medicine

## 2023-02-24 ENCOUNTER — Other Ambulatory Visit: Payer: Self-pay

## 2023-02-24 ENCOUNTER — Encounter (HOSPITAL_COMMUNITY): Payer: Self-pay | Admitting: *Deleted

## 2023-02-24 DIAGNOSIS — R1031 Right lower quadrant pain: Secondary | ICD-10-CM | POA: Insufficient documentation

## 2023-02-24 DIAGNOSIS — N3090 Cystitis, unspecified without hematuria: Secondary | ICD-10-CM | POA: Insufficient documentation

## 2023-02-24 LAB — POCT URINALYSIS DIP (MANUAL ENTRY)
Bilirubin, UA: NEGATIVE
Glucose, UA: NEGATIVE mg/dL
Ketones, POC UA: NEGATIVE mg/dL
Nitrite, UA: POSITIVE — AB
Protein Ur, POC: 100 mg/dL — AB
Spec Grav, UA: 1.025 (ref 1.010–1.025)
Urobilinogen, UA: 0.2 U/dL
pH, UA: 5.5 (ref 5.0–8.0)

## 2023-02-24 LAB — POCT URINE PREGNANCY: Preg Test, Ur: NEGATIVE

## 2023-02-24 MED ORDER — NITROFURANTOIN MONOHYD MACRO 100 MG PO CAPS: 100.0000 mg | ORAL_CAPSULE | Freq: Two times a day (BID) | ORAL | 0 refills | Status: DC

## 2023-02-24 MED ORDER — IBUPROFEN 800 MG PO TABS
800.0000 mg | ORAL_TABLET | Freq: Three times a day (TID) | ORAL | 0 refills | Status: DC
Start: 2023-02-24 — End: 2023-03-24

## 2023-02-24 NOTE — Discharge Instructions (Addendum)
You have a urinary tract infection.  Please take all antibiotics as prescribed and until finished, take them with food to help prevent gastrointestinal upset.  For any pain and inflammation you can take 800 mg of ibuprofen 3 times daily with food as well.  Ensure you are drinking only 64 ounces of water daily.  We have sent your urine off for culture and we will contact you if we need to modify your antibiotic treatment.  Return to clinic if no improvement despite finishing antibiotics, you develop nausea, vomiting, flank pain or fever.

## 2023-02-24 NOTE — ED Triage Notes (Signed)
C/O intermittent RLQ and suprapubic abd pain onset yesterday; states started becoming constant @ 0400 this AM. Has taken IBU with slight improvement. C/O painful ambulation. Denies n/v/d. Denies fevers. Denies vaginal discharge.

## 2023-02-24 NOTE — ED Provider Notes (Signed)
[facility]    CSN: [id number] Arrival date & time: 02/24/23  1150      History   Chief Complaint Chief Complaint  Patient presents with   Abdominal Pain    HPI Diane Zimmerman is a 42 y.o. female.   Patient presents to clinic for complaints of right lower quadrant and suprapubic abdominal pain that started yesterday.  Became more constant around 4:00 this morning.  She has taken ibuprofen with some changes.  She does have suprapubic discomfort with ambulation.  Denies N/V/D. Denies fevers. No flank pain. No vaginal discharge.   Hx of uterine fibroids   The history is provided by the patient and medical records.  Abdominal Pain Associated symptoms: no diarrhea, no dysuria, no fever, no nausea, no vaginal bleeding, no vaginal discharge and no vomiting     Past Medical History:  Diagnosis Date   Thrombocytopenia (HCC)    Uterine fibroid     Patient Active Problem List   Diagnosis Date Noted   SVD (spontaneous vaginal delivery) 08/19/2014   Second-degree perineal laceration, with delivery 08/19/2014    Past Surgical History:  Procedure Laterality Date   UTERINE FIBROID SURGERY      OB History     Gravida  6   Para  3   Term  1   Preterm      AB  2   Living  3      SAB  2   IAB      Ectopic      Multiple  0   Live Births  1            Home Medications    Prior to Admission medications   Medication Sig Start Date End Date Taking? Authorizing Provider  ibuprofen (ADVIL) 800 MG tablet Take 1 tablet (800 mg total) by mouth 3 (three) times daily. 02/24/23  Yes Rinaldo Ratel, [place] N, FNP  nitrofurantoin, macrocrystal-monohydrate, (MACROBID) 100 MG capsule Take 1 capsule (100 mg total) by mouth 2 (two) times daily. 02/24/23  Yes Rinaldo Ratel, [place] N, FNP  atovaquone-proguanil (MALARONE) 250-100 MG TABS tablet Take 1 tablet by mouth daily. 10/28/19   Simmons-Robinson, Makiera, MD  Butalbital-APAP-Caffeine 50-300-40 MG CAPS Take  1 capsule by mouth every 4 (four) hours as needed (headaches).    [provider]  Docosahexaenoic Acid (DHA PO) Take 1 capsule by mouth daily.    [provider]  FeFum-FePoly-FA-B Cmp-C-Biot (INTEGRA PLUS PO) Take 1 capsule by mouth every other day.     [provider]  ferrous sulfate 325 (65 FE) MG EC tablet Take 1 tablet (325 mg total) by mouth 2 (two) times daily. 08/21/14 08/21/15  Standard, Venus, CNM  NIFEdipine (PROCARDIA-XL/ADALAT CC) 30 MG 24 hr tablet Take 1 tablet (30 mg total) by mouth daily. 09/01/14   Standard, Venus, CNM  oxyCODONE-acetaminophen (PERCOCET/ROXICET) 5-325 MG per tablet Take 1 tablet by mouth every 4 (four) hours as needed (for pain scale less than 7). 08/21/14   Standard, Venus, CNM  predniSONE (STERAPRED UNI-PAK 21 TAB) 10 MG (21) TBPK tablet As directed 11/22/21   Raspet, Noberto Retort, PA-C  Prenatal Vit-Fe Fumarate-FA (PRENATAL MULTIVITAMIN) TABS tablet Take 1 tablet by mouth daily at 12 noon.    [provider]    Family History Family History  Problem Relation Age of Onset   Alcohol abuse Neg Hx    Arthritis Neg Hx    Asthma Neg Hx    Birth defects Neg Hx  Cancer Neg Hx    COPD Neg Hx    Depression Neg Hx    Diabetes Neg Hx    Drug abuse Neg Hx    Early death Neg Hx    Hearing loss Neg Hx    Heart disease Neg Hx    Hyperlipidemia Neg Hx    Hypertension Neg Hx    Kidney disease Neg Hx    Learning disabilities Neg Hx    Mental illness Neg Hx    Mental retardation Neg Hx    Miscarriages / Stillbirths Neg Hx    Stroke Neg Hx    Vision loss Neg Hx    Varicose Veins Neg Hx    Healthy Mother     Social History Social History   Tobacco Use   Smoking status: Never   Smokeless tobacco: Never  Vaping Use   Vaping status: Never Used  Substance Use Topics   Alcohol use: No   Drug use: No     Allergies   Patient has no known allergies.   Review of Systems Review of Systems  Constitutional:  Negative for  fever.  Gastrointestinal:  Positive for abdominal pain. Negative for diarrhea, nausea and vomiting.  Genitourinary:  Negative for dysuria, flank pain, menstrual problem, vaginal bleeding and vaginal discharge.     Physical Exam Triage Vital Signs ED Triage Vitals  Encounter Vitals Group     BP 02/24/23 1253 132/83     Systolic BP Percentile --      Diastolic BP Percentile --      Pulse Rate 02/24/23 1253 84     Resp 02/24/23 1253 18     Temp 02/24/23 1253 98.4 F (36.9 C)     Temp Source 02/24/23 1253 Oral     SpO2 02/24/23 1253 97 %     Weight --      Height --      Head Circumference --      Peak Flow --      Pain Score 02/24/23 1255 7     Pain Loc --      Pain Education --      Exclude from Growth Chart --    No data found.  Updated Vital Signs BP 132/83   Pulse 84   Temp 98.4 F (36.9 C) (Oral)   Resp 18   LMP 01/29/2023 (Exact Date)   SpO2 97%   Breastfeeding No   Visual Acuity Right Eye Distance:   Left Eye Distance:   Bilateral Distance:    Right Eye Near:   Left Eye Near:    Bilateral Near:     Physical Exam Vitals and nursing note reviewed.  Constitutional:      Appearance: Normal appearance. She is well-developed.  HENT:     Head: Normocephalic and atraumatic.     Right Ear: External ear normal.     Left Ear: External ear normal.     Nose: Nose normal.     Mouth/Throat:     Mouth: Mucous membranes are moist.  Eyes:     Conjunctiva/sclera: Conjunctivae normal.  Cardiovascular:     Rate and Rhythm: Normal rate.  Pulmonary:     Effort: Pulmonary effort is normal. No respiratory distress.  Abdominal:     General: Abdomen is flat. Bowel sounds are normal.     Palpations: Abdomen is soft.     Tenderness: There is abdominal tenderness in the right lower quadrant and suprapubic area.  Skin:    General:  Skin is warm and dry.  Neurological:     General: No focal deficit present.     Mental Status: She is alert and oriented to person, place,  and time.  Psychiatric:        Mood and Affect: Mood normal.        Behavior: Behavior normal.      UC Treatments / Results  Labs (all labs ordered are listed, but only abnormal results are displayed) Labs Reviewed  POCT URINALYSIS DIP (MANUAL ENTRY) - Abnormal; Notable for the following components:      Result Value   Color, UA straw (*)    Clarity, UA cloudy (*)    Blood, UA large (*)    Protein Ur, POC =100 (*)    Nitrite, UA Positive (*)    Leukocytes, UA Small (1+) (*)    All other components within normal limits  URINE CULTURE  POCT URINE PREGNANCY    EKG   Radiology No results found.  Procedures Procedures (including critical care time)  Medications Ordered in UC Medications - No data to display  Initial Impression / Assessment and Plan / UC Course  I have reviewed the triage vital signs and the nursing notes.  Pertinent labs & imaging results that were available during my care of the patient were reviewed by me and considered in my medical decision making (see chart for details).  Vitals and triage reviewed, patient is hemodynamically stable. Abdomen is soft with active BS, mild RLQ TTP. Negative for CVA tenderness. RLQ and suprapubic pain. Negative urine pregnancy, UA with large RBCs, nitrites and small leukocytes, will send for culture. Macrobid and IBU started.  Plan of care, follow-up care and return precautions given, no questions at this time.     Final Clinical Impressions(s) / UC Diagnoses   Final diagnoses:  Cystitis  Right lower quadrant abdominal pain     Discharge Instructions      You have a urinary tract infection.  Please take all antibiotics as prescribed and until finished, take them with food to help prevent gastrointestinal upset.  For any pain and inflammation you can take 800 mg of ibuprofen 3 times daily with food as well.  Ensure you are drinking only 64 ounces of water daily.  We have sent your urine off for culture and we  will contact you if we need to modify your antibiotic treatment.  Return to clinic if no improvement despite finishing antibiotics, you develop nausea, vomiting, flank pain or fever.      ED Prescriptions     Medication Sig Dispense Auth. Provider   nitrofurantoin, macrocrystal-monohydrate, (MACROBID) 100 MG capsule Take 1 capsule (100 mg total) by mouth 2 (two) times daily. 10 capsule Rinaldo Ratel, [place] N, [place]   ibuprofen (ADVIL) 800 MG tablet Take 1 tablet (800 mg total) by mouth 3 (three) times daily. 21 tablet Cecylia Brazill, [place] N, [place]      PDMP not reviewed this encounter.   Brynnly Bonet, [place] N, [place] 02/24/23 1322

## 2023-02-26 LAB — URINE CULTURE: Culture: >=100000 — AB

## 2023-03-24 ENCOUNTER — Encounter (HOSPITAL_COMMUNITY): Payer: Self-pay

## 2023-03-24 ENCOUNTER — Ambulatory Visit (HOSPITAL_COMMUNITY)
Admission: EM | Admit: 2023-03-24 | Discharge: 2023-03-24 | Disposition: A | Discharge status: Home / Self Care | Payer: Medicaid Other | Attending: Emergency Medicine | Admitting: Emergency Medicine

## 2023-03-24 DIAGNOSIS — M62830 Muscle spasm of back: Secondary | ICD-10-CM

## 2023-03-24 MED ORDER — METHOCARBAMOL 500 MG PO TABS: 500.0000 mg | ORAL_TABLET | Freq: Two times a day (BID) | ORAL | 0 refills | Status: AC

## 2023-03-24 MED ORDER — IBUPROFEN 600 MG PO TABS: 600.0000 mg | ORAL_TABLET | Freq: Four times a day (QID) | ORAL | 0 refills | Status: AC | PRN

## 2023-03-24 MED ORDER — PREDNISONE 20 MG PO TABS: 40.0000 mg | ORAL_TABLET | Freq: Every day | ORAL | 0 refills | Status: AC

## 2023-03-24 NOTE — ED Provider Notes (Signed)
[facility]    CSN: [id number] Arrival date & time: 03/24/23  1056      History   Chief Complaint Chief Complaint  Patient presents with   Shoulder Pain    HPI Diane Zimmerman is a 42 y.o. female.   Patient complains to clinic for right posterior scapular pain this started on Wednesday. Her right shoulder range of motion is limited due to pain and the area is hard and tender to palpation.  On Wednesday she took ibuprofen, last dose was yesterday, this has not been helping her pain.  Denies any heavy lifting, hyperextension, falls or recent injuries.    The history is provided by the patient and medical records.  Shoulder Pain Associated symptoms: no neck pain     Past Medical History:  Diagnosis Date   Thrombocytopenia (HCC)    Uterine fibroid     Patient Active Problem List   Diagnosis Date Noted   SVD (spontaneous vaginal delivery) 08/19/2014   Second-degree perineal laceration, with delivery 08/19/2014    Past Surgical History:  Procedure Laterality Date   UTERINE FIBROID SURGERY      OB History     Gravida  6   Para  3   Term  1   Preterm      AB  2   Living  3      SAB  2   IAB      Ectopic      Multiple  0   Live Births  1            Home Medications    Prior to Admission medications   Medication Sig Start Date End Date Taking? Authorizing Provider  ibuprofen (ADVIL) 600 MG tablet Take 1 tablet (600 mg total) by mouth every 6 (six) hours as needed. 03/24/23  Yes Rinaldo Ratel, [place] N, FNP  methocarbamol (ROBAXIN) 500 MG tablet Take 1 tablet (500 mg total) by mouth 2 (two) times daily. 03/24/23  Yes Rinaldo Ratel, [place] N, FNP  predniSONE (DELTASONE) 20 MG tablet Take 2 tablets (40 mg total) by mouth daily for 5 days. 03/24/23 03/29/23 Yes Rinaldo Ratel, [place] N, FNP  Prenatal Vit-Fe Fumarate-FA (PRENATAL MULTIVITAMIN) TABS tablet Take 1 tablet by mouth daily at 12 noon. Patient not taking: Reported on  03/24/2023    [provider]    Family History Family History  Problem Relation Age of Onset   Alcohol abuse Neg Hx    Arthritis Neg Hx    Asthma Neg Hx    Birth defects Neg Hx    Cancer Neg Hx    COPD Neg Hx    Depression Neg Hx    Diabetes Neg Hx    Drug abuse Neg Hx    Early death Neg Hx    Hearing loss Neg Hx    Heart disease Neg Hx    Hyperlipidemia Neg Hx    Hypertension Neg Hx    Kidney disease Neg Hx    Learning disabilities Neg Hx    Mental illness Neg Hx    Mental retardation Neg Hx    Miscarriages / Stillbirths Neg Hx    Stroke Neg Hx    Vision loss Neg Hx    Varicose Veins Neg Hx    Healthy Mother     Social History Social History   Tobacco Use   Smoking status: Never   Smokeless tobacco: Never  Vaping Use   Vaping status: Never Used  Substance Use Topics  Alcohol use: No   Drug use: No     Allergies   Patient has no known allergies.   Review of Systems Review of Systems  Musculoskeletal:  Negative for joint swelling, neck pain and neck stiffness.     Physical Exam Triage Vital Signs ED Triage Vitals  Encounter Vitals Group     BP 03/24/23 1156 139/86     Systolic BP Percentile --      Diastolic BP Percentile --      Pulse Rate 03/24/23 1156 74     Resp 03/24/23 1156 16     Temp 03/24/23 1156 98.3 F (36.8 C)     Temp Source 03/24/23 1156 Oral     SpO2 03/24/23 1156 96 %     Weight --      Height 03/24/23 1156 5\' 4"  (1.626 m)     Head Circumference --      Peak Flow --      Pain Score 03/24/23 1155 8     Pain Loc --      Pain Education --      Exclude from Growth Chart --    No data found.  Updated Vital Signs BP 139/86 (BP Location: Right Arm)   Pulse 74   Temp 98.3 F (36.8 C) (Oral)   Resp 16   Ht 5\' 4"  (1.626 m)   LMP 02/24/2023 (Exact Date)   SpO2 96%   BMI 26.85 kg/m   Visual Acuity Right Eye Distance:   Left Eye Distance:   Bilateral Distance:    Right Eye Near:   Left Eye Near:     Bilateral Near:     Physical Exam Vitals and nursing note reviewed.  Constitutional:      Appearance: Normal appearance.  HENT:     Head: Normocephalic and atraumatic.     Right Ear: External ear normal.     Left Ear: External ear normal.     Nose: Nose normal.     Mouth/Throat:     Mouth: Mucous membranes are moist.  Eyes:     General: No scleral icterus. Cardiovascular:     Rate and Rhythm: Normal rate.  Pulmonary:     Effort: Pulmonary effort is normal. No respiratory distress.  Musculoskeletal:        General: Tenderness present. No swelling, deformity or signs of injury.     Cervical back: Normal range of motion. Spasms and tenderness present.       Back:     Comments: Right trapezius spasm on palpation, tight and tender. Limited right shoulder / arm adduction, triggers pain. Strength in upper extremities 5/5.   Skin:    General: Skin is warm and dry.  Neurological:     General: No focal deficit present.     Mental Status: She is alert and oriented to person, place, and time.  Psychiatric:        Mood and Affect: Mood normal.        Behavior: Behavior normal. Behavior is cooperative.      UC Treatments / Results  Labs (all labs ordered are listed, but only abnormal results are displayed) Labs Reviewed - No data to display  EKG   Radiology No results found.  Procedures Procedures (including critical care time)  Medications Ordered in UC Medications - No data to display  Initial Impression / Assessment and Plan / UC Course  I have reviewed the triage vital signs and the nursing notes.  Pertinent labs &  imaging results that were available during my care of the patient were reviewed by me and considered in my medical decision making (see chart for details).  Vitals in triage reviewed, patient is hemodynamically stable.  Atraumatic.  Trapezius muscle close to scapular area is tight and tender to palpation, consistent with muscle spasm.  Discussed NSAIDs,  muscle relaxers and prednisone for inflammation.  Symptomatic management with heat and massage discussed.  Plan of care, follow-up care and return precautions given, no questions at this time.     Final Clinical Impressions(s) / UC Diagnoses   Final diagnoses:  Spasm of right trapezius muscle     Discharge Instructions      You are having a muscle spasm.  Please do warm compresses, massage and gentle stretching to this area.  Take the muscle relaxers twice daily, do not drink or drive on these medications as he may cause drowsiness.  Start the steroids today with breakfast for the next 5 days to help with inflammation.  Take the Advil every 6 hours.  Pain and inflammation.  Return to clinic if no improvement in symptoms despite these interventions, or any new concerning symptoms.      ED Prescriptions     Medication Sig Dispense Auth. Provider   methocarbamol (ROBAXIN) 500 MG tablet Take 1 tablet (500 mg total) by mouth 2 (two) times daily. 20 tablet Rinaldo Ratel, [place] N, [place]   predniSONE (DELTASONE) 20 MG tablet Take 2 tablets (40 mg total) by mouth daily for 5 days. 10 tablet Rinaldo Ratel, [place] N, [place]   ibuprofen (ADVIL) 600 MG tablet Take 1 tablet (600 mg total) by mouth every 6 (six) hours as needed. 30 tablet Heatherly Stenner, [place] N, [place]      PDMP not reviewed this encounter.   Seletha Zimmermann, [place] N, [place] 03/24/23 [phone]

## 2023-03-24 NOTE — Discharge Instructions (Signed)
You are having a muscle spasm.  Please do warm compresses, massage and gentle stretching to this area.  Take the muscle relaxers twice daily, do not drink or drive on these medications as he may cause drowsiness.  Start the steroids today with breakfast for the next 5 days to help with inflammation.  Take the Advil every 6 hours.  Pain and inflammation.  Return to clinic if no improvement in symptoms despite these interventions, or any new concerning symptoms.

## 2023-03-24 NOTE — ED Triage Notes (Signed)
Patient here today with c/o posterior right shoulder pain X 3 days. She has taken IBU with some relief. No known injury.
